# Patient Record
Sex: Female | Born: 1995 | Race: Black or African American | Hispanic: No | Marital: Single | Smoking: Former smoker
Health system: Southern US, Community
[De-identification: ages and names within clinical notes are randomized; demographics above are authoritative.]

## PROBLEM LIST (undated history)

## (undated) ENCOUNTER — Inpatient Hospital Stay (HOSPITAL_COMMUNITY): Payer: Self-pay

## (undated) DIAGNOSIS — B9689 Other specified bacterial agents as the cause of diseases classified elsewhere: Secondary | ICD-10-CM

## (undated) DIAGNOSIS — D649 Anemia, unspecified: Secondary | ICD-10-CM

## (undated) DIAGNOSIS — N760 Acute vaginitis: Secondary | ICD-10-CM

## (undated) DIAGNOSIS — Z789 Other specified health status: Secondary | ICD-10-CM

## (undated) HISTORY — PX: NO PAST SURGERIES: SHX2092

---

## 2016-08-19 LAB — OB RESULTS CONSOLE GC/CHLAMYDIA
Chlamydia: NEGATIVE
Gonorrhea: NEGATIVE

## 2016-08-20 LAB — OB RESULTS CONSOLE HIV ANTIBODY (ROUTINE TESTING): HIV: NONREACTIVE

## 2016-08-20 LAB — OB RESULTS CONSOLE RPR: RPR: NONREACTIVE

## 2016-08-20 LAB — OB RESULTS CONSOLE ANTIBODY SCREEN: Antibody Screen: NEGATIVE

## 2016-08-20 LAB — OB RESULTS CONSOLE ABO/RH: RH Type: POSITIVE

## 2016-09-25 ENCOUNTER — Encounter (HOSPITAL_COMMUNITY): Payer: Self-pay | Admitting: *Deleted

## 2016-09-25 ENCOUNTER — Inpatient Hospital Stay (HOSPITAL_COMMUNITY)
Admission: AD | Admit: 2016-09-25 | Discharge: 2016-09-26 | Disposition: A | Discharge status: Home / Self Care | Payer: Medicaid Other | Source: Ambulatory Visit | Attending: Obstetrics & Gynecology | Admitting: Obstetrics & Gynecology

## 2016-09-25 DIAGNOSIS — B9689 Other specified bacterial agents as the cause of diseases classified elsewhere: Secondary | ICD-10-CM

## 2016-09-25 DIAGNOSIS — O23593 Infection of other part of genital tract in pregnancy, third trimester: Secondary | ICD-10-CM | POA: Diagnosis not present

## 2016-09-25 DIAGNOSIS — Z8619 Personal history of other infectious and parasitic diseases: Secondary | ICD-10-CM | POA: Diagnosis not present

## 2016-09-25 DIAGNOSIS — Z3A29 29 weeks gestation of pregnancy: Secondary | ICD-10-CM | POA: Diagnosis not present

## 2016-09-25 DIAGNOSIS — N760 Acute vaginitis: Secondary | ICD-10-CM | POA: Diagnosis present

## 2016-09-25 DIAGNOSIS — R109 Unspecified abdominal pain: Secondary | ICD-10-CM

## 2016-09-25 DIAGNOSIS — O26893 Other specified pregnancy related conditions, third trimester: Secondary | ICD-10-CM

## 2016-09-25 HISTORY — DX: Other specified health status: Z78.9

## 2016-09-25 HISTORY — DX: Other specified bacterial agents as the cause of diseases classified elsewhere: B96.89

## 2016-09-25 HISTORY — DX: Acute vaginitis: N76.0

## 2016-09-25 HISTORY — DX: Acute vaginitis: B96.89

## 2016-09-25 LAB — URINALYSIS, ROUTINE W REFLEX MICROSCOPIC
BACTERIA UA: NONE SEEN
BILIRUBIN URINE: NEGATIVE
GLUCOSE, UA: NEGATIVE mg/dL
HGB URINE DIPSTICK: NEGATIVE
Ketones, ur: NEGATIVE mg/dL
NITRITE: NEGATIVE
PH: 6 (ref 5.0–8.0)
Protein, ur: NEGATIVE mg/dL
SPECIFIC GRAVITY, URINE: 1.017 (ref 1.005–1.030)

## 2016-09-25 LAB — COMPREHENSIVE METABOLIC PANEL
ALK PHOS: 78 U/L (ref 38–126)
ALT: 12 U/L — AB (ref 14–54)
ANION GAP: 5 (ref 5–15)
AST: 22 U/L (ref 15–41)
Albumin: 3.1 g/dL — ABNORMAL LOW (ref 3.5–5.0)
BUN: <5 mg/dL — ABNORMAL LOW (ref 6–20)
CALCIUM: 9.1 mg/dL (ref 8.9–10.3)
CHLORIDE: 104 mmol/L (ref 101–111)
CO2: 25 mmol/L (ref 22–32)
CREATININE: 0.53 mg/dL (ref 0.44–1.00)
Glucose, Bld: 76 mg/dL (ref 65–99)
Potassium: 3.9 mmol/L (ref 3.5–5.1)
SODIUM: 134 mmol/L — AB (ref 135–145)
Total Bilirubin: 0.3 mg/dL (ref 0.3–1.2)
Total Protein: 7 g/dL (ref 6.5–8.1)

## 2016-09-25 LAB — CBC WITH DIFFERENTIAL/PLATELET
Basophils Absolute: 0 10*3/uL (ref 0.0–0.1)
Basophils Relative: 0 %
EOS ABS: 0.1 10*3/uL (ref 0.0–0.7)
EOS PCT: 1 %
HCT: 29.3 % — ABNORMAL LOW (ref 36.0–46.0)
Hemoglobin: 10.1 g/dL — ABNORMAL LOW (ref 12.0–15.0)
LYMPHS ABS: 2.7 10*3/uL (ref 0.7–4.0)
LYMPHS PCT: 29 %
MCH: 25.2 pg — AB (ref 26.0–34.0)
MCHC: 34.5 g/dL (ref 30.0–36.0)
MCV: 73.1 fL — AB (ref 78.0–100.0)
MONO ABS: 0.3 10*3/uL (ref 0.1–1.0)
MONOS PCT: 3 %
Neutro Abs: 6.1 10*3/uL (ref 1.7–7.7)
Neutrophils Relative %: 66 %
PLATELETS: 223 10*3/uL (ref 150–400)
RBC: 4.01 MIL/uL (ref 3.87–5.11)
RDW: 14.4 % (ref 11.5–15.5)
WBC: 9.2 10*3/uL (ref 4.0–10.5)

## 2016-09-25 LAB — FETAL FIBRONECTIN: Fetal Fibronectin: NEGATIVE

## 2016-09-25 LAB — WET PREP, GENITAL
Sperm: NONE SEEN
TRICH WET PREP: NONE SEEN
YEAST WET PREP: NONE SEEN

## 2016-09-25 MED ORDER — METRONIDAZOLE 500 MG PO TABS: 500.0000 mg | ORAL_TABLET | Freq: Two times a day (BID) | ORAL | 0 refills | Status: AC

## 2016-09-25 NOTE — Discharge Instructions (Signed)
Stay well-hydrated with at least 8 bottles of water daily It is safe for you to take Tylenol [phone] mg by mouth every 6-8 hrs as needed for pain

## 2016-09-25 NOTE — MAU Note (Signed)
Pt reports she started having lower abd pain/cramping about one hour ago after an episode of diarrhea. Denies bleeding.

## 2016-09-25 NOTE — MAU Provider Note (Signed)
History     CSN: [id number]  Arrival date and time: 09/25/16 2126   First Provider Initiated Contact with Patient 09/25/16 2157      Chief Complaint  Patient presents with  . Abdominal Cramping   Ms. Miranda James is a 21 yo G1P0 at 29.[redacted] wks gestation presenting tonight with complaints of abdominal cramping like menstrual cramps and 1 episode of diarrhea.  She reports that she had cereal with milk and fish today.  She seems to think that the milk she had is causing her cramping and diarrhea.  She has not had this type of pain in the pregnancy before.  She reports good FM today. She denies N/V, VB or abnormal vaginal d/c.  Abdominal Pain  This is a new problem. The current episode started today. The onset quality is sudden. The problem occurs intermittently. The most recent episode lasted 2 hours. The problem has been unchanged. The pain is located in the suprapubic region. The pain is at a severity of 8/10. The pain is moderate. The quality of the pain is cramping. The abdominal pain does not radiate. Associated symptoms include diarrhea. Pertinent negatives include no nausea or vomiting. Nothing aggravates the pain. The pain is relieved by nothing. She has tried nothing for the symptoms. The treatment provided no relief.    Past Medical History:  Diagnosis Date  . Bacterial vaginitis 09/25/2016  . Medical history non-contributory     Past Surgical History:  Procedure Laterality Date  . NO PAST SURGERIES      History reviewed. No pertinent family history.  Social History  Substance Use Topics  . Smoking status: Never Smoker  . Smokeless tobacco: Never Used  . Alcohol use No    Allergies: No Known Allergies  No prescriptions prior to admission.    Review of Systems  Constitutional: Negative.   HENT: Negative.   Eyes: Negative.   Respiratory: Negative.   Cardiovascular: Negative.   Gastrointestinal: Positive for abdominal pain (lower abdomen) and diarrhea. Negative  for nausea and vomiting.  Endocrine: Negative.   Genitourinary: Negative.   Musculoskeletal: Negative.   Skin: Negative.   Allergic/Immunologic: Negative.   Neurological: Negative.   Hematological: Negative.   Psychiatric/Behavioral: Negative.    Physical Exam   Blood pressure 118/68, pulse 93, temperature 98.5 F (36.9 C), temperature source Oral, resp. rate 19, height 5\' 1"  (1.549 m), weight 78.5 kg (173 lb), SpO2 100 %.  Physical Exam  Constitutional: She is oriented to person, place, and time. She appears well-developed and well-nourished.  HENT:  Head: Normocephalic.  Eyes: Pupils are equal, round, and reactive to light.  Neck: Normal range of motion.  Cardiovascular: Normal rate, regular rhythm, normal heart sounds and intact distal pulses.   Respiratory: Effort normal and breath sounds normal.  GI: Soft. Bowel sounds are normal. She exhibits no mass. There is tenderness (in all quadrants). There is no rebound and no guarding.  Genitourinary: Rectum normal. Uterus is enlarged (S=D). Cervix exhibits discharge. Cervix exhibits no motion tenderness and no friability. Right adnexum displays tenderness (exquisite). Left adnexum displays tenderness (exquisite).    Genitourinary Comments: cx; smooth, pink, small lesions noted at 5 o'clock position, small amt of yellow-green d/c from cervical os, closed/long/soft, no CMT or friability, exquisite adnexal tenderness bilaterally   Musculoskeletal: Normal range of motion.  Neurological: She is alert and oriented to person, place, and time. She has normal reflexes.  Skin: Skin is warm and dry.  Psychiatric: She has a normal mood and  affect. Her behavior is normal. Judgment and thought content normal.   Results for orders placed or performed during the hospital encounter of 09/25/16 (from the past 24 hour(s))  Urinalysis, Routine w reflex microscopic     Status: Abnormal   Collection Time: 09/25/16  9:26 PM  Result Value Ref Range    Color, Urine YELLOW YELLOW   APPearance CLOUDY (A) CLEAR   Specific Gravity, Urine 1.017 1.005 - 1.030   pH 6.0 5.0 - 8.0   Glucose, UA NEGATIVE NEGATIVE mg/dL   Hgb urine dipstick NEGATIVE NEGATIVE   Bilirubin Urine NEGATIVE NEGATIVE   Ketones, ur NEGATIVE NEGATIVE mg/dL   Protein, ur NEGATIVE NEGATIVE mg/dL   Nitrite NEGATIVE NEGATIVE   Leukocytes, UA MODERATE (A) NEGATIVE   RBC / HPF 0-5 0 - 5 RBC/hpf   WBC, UA 6-30 0 - 5 WBC/hpf   Bacteria, UA NONE SEEN NONE SEEN   Squamous Epithelial / LPF TOO NUMEROUS TO COUNT (A) NONE SEEN   Mucous PRESENT   Wet prep, genital     Status: Abnormal   Collection Time: 09/25/16 10:07 PM  Result Value Ref Range   Yeast Wet Prep HPF POC NONE SEEN NONE SEEN   Trich, Wet Prep NONE SEEN NONE SEEN   Clue Cells Wet Prep HPF POC PRESENT (A) NONE SEEN   WBC, Wet Prep HPF POC MODERATE (A) NONE SEEN   Sperm NONE SEEN   Fetal fibronectin     Status: None   Collection Time: 09/25/16 10:07 PM  Result Value Ref Range   Fetal Fibronectin NEGATIVE NEGATIVE  CBC with Differential/Platelet     Status: Abnormal   Collection Time: 09/25/16 10:35 PM  Result Value Ref Range   WBC 9.2 4.0 - 10.5 K/uL   RBC 4.01 3.87 - 5.11 MIL/uL   Hemoglobin 10.1 (L) 12.0 - 15.0 g/dL   HCT 16.129.3 (L) 09.636.0 - 04.546.0 %   MCV 73.1 (L) 78.0 - 100.0 fL   MCH 25.2 (L) 26.0 - 34.0 pg   MCHC 34.5 30.0 - 36.0 g/dL   RDW 40.914.4 81.111.5 - 91.415.5 %   Platelets 223 150 - 400 K/uL   Neutrophils Relative % 66 %   Neutro Abs 6.1 1.7 - 7.7 K/uL   Lymphocytes Relative 29 %   Lymphs Abs 2.7 0.7 - 4.0 K/uL   Monocytes Relative 3 %   Monocytes Absolute 0.3 0.1 - 1.0 K/uL   Eosinophils Relative 1 %   Eosinophils Absolute 0.1 0.0 - 0.7 K/uL   Basophils Relative 0 %   Basophils Absolute 0.0 0.0 - 0.1 K/uL    CEFM  FHR: 125 bpm / moderate variability / accels present / decels absent TOCO: none  MAU Course  Procedures  MDM CBC w/ diff CMP NST Wet Prep GC/CT Consult with Dr. Mora ApplPinn - ok to d/c  with Rx for Flagyl, stay hydrated, ok to take Tylenol prn Assessment and Plan  Bacterial vaginitis - Flagyl 500 mg po BID x 7 days  Abdominal pain during pregnancy in third trimester - Educated to stay well-hydrated with at least 8 bottles of water daily - Advised to take Tylenol [phone] mg po every 6-8 hrs prn pain - F/U with GVOB prn and at scheduled appts  Discharge home Patient verbalized an understanding of the plan of care and agrees.   Raelyn Moraolitta Rosco Harriott, MSN, CNM 09/25/2016, 11:27 PM

## 2016-09-26 LAB — GC/CHLAMYDIA PROBE AMP (~~LOC~~) NOT AT ARMC
CHLAMYDIA, DNA PROBE: NEGATIVE
NEISSERIA GONORRHEA: NEGATIVE

## 2016-10-16 LAB — OB RESULTS CONSOLE HEPATITIS B SURFACE ANTIGEN: HEP B S AG: NEGATIVE

## 2016-11-12 LAB — OB RESULTS CONSOLE GC/CHLAMYDIA
CHLAMYDIA, DNA PROBE: NEGATIVE
GC PROBE AMP, GENITAL: NEGATIVE

## 2016-11-14 LAB — OB RESULTS CONSOLE GBS: GBS: NEGATIVE

## 2016-12-10 ENCOUNTER — Inpatient Hospital Stay (HOSPITAL_COMMUNITY): Admission: AD | Admit: 2016-12-10 | Payer: Medicaid Other | Source: Ambulatory Visit | Admitting: Family Medicine

## 2016-12-14 ENCOUNTER — Inpatient Hospital Stay (HOSPITAL_COMMUNITY)
Admission: RE | Admit: 2016-12-14 | Discharge: 2016-12-18 | DRG: 766 | Disposition: A | Discharge status: Home / Self Care | Payer: Medicaid Other | Source: Ambulatory Visit | Attending: Obstetrics & Gynecology | Admitting: Obstetrics & Gynecology

## 2016-12-14 ENCOUNTER — Inpatient Hospital Stay (HOSPITAL_COMMUNITY): Payer: Medicaid Other | Admitting: Anesthesiology

## 2016-12-14 ENCOUNTER — Encounter (HOSPITAL_COMMUNITY): Payer: Self-pay

## 2016-12-14 DIAGNOSIS — O365930 Maternal care for other known or suspected poor fetal growth, third trimester, not applicable or unspecified: Principal | ICD-10-CM | POA: Diagnosis present

## 2016-12-14 DIAGNOSIS — O9902 Anemia complicating childbirth: Secondary | ICD-10-CM | POA: Diagnosis present

## 2016-12-14 DIAGNOSIS — Z3A40 40 weeks gestation of pregnancy: Secondary | ICD-10-CM

## 2016-12-14 DIAGNOSIS — O365990 Maternal care for other known or suspected poor fetal growth, unspecified trimester, not applicable or unspecified: Secondary | ICD-10-CM | POA: Diagnosis present

## 2016-12-14 DIAGNOSIS — D649 Anemia, unspecified: Secondary | ICD-10-CM | POA: Diagnosis present

## 2016-12-14 LAB — CBC
HCT: 33 % — ABNORMAL LOW (ref 36.0–46.0)
Hemoglobin: 10.8 g/dL — ABNORMAL LOW (ref 12.0–15.0)
MCH: 23.1 pg — AB (ref 26.0–34.0)
MCHC: 32.7 g/dL (ref 30.0–36.0)
MCV: 70.7 fL — AB (ref 78.0–100.0)
PLATELETS: 212 10*3/uL (ref 150–400)
RBC: 4.67 MIL/uL (ref 3.87–5.11)
RDW: 15.3 % (ref 11.5–15.5)
WBC: 7.5 10*3/uL (ref 4.0–10.5)

## 2016-12-14 LAB — ABO/RH: ABO/RH(D): O POS

## 2016-12-14 LAB — TYPE AND SCREEN
ABO/RH(D): O POS
Antibody Screen: NEGATIVE

## 2016-12-14 MED ORDER — TERBUTALINE SULFATE 1 MG/ML IJ SOLN: 0.2500 mg | Freq: Once | INTRAMUSCULAR | Status: DC | PRN

## 2016-12-14 MED ORDER — LACTATED RINGERS IV SOLN
INTRAVENOUS | Status: DC
  Administered 2016-12-14 – 2016-12-15 (×2): via INTRAVENOUS

## 2016-12-14 MED ORDER — BUTORPHANOL TARTRATE 1 MG/ML IJ SOLN: 1.0000 mg | INTRAMUSCULAR | Status: DC | PRN

## 2016-12-14 MED ORDER — PHENYLEPHRINE 40 MCG/ML (10ML) SYRINGE FOR IV PUSH (FOR BLOOD PRESSURE SUPPORT): 80.0000 ug | PREFILLED_SYRINGE | INTRAVENOUS | Status: DC | PRN

## 2016-12-14 MED ORDER — OXYCODONE-ACETAMINOPHEN 5-325 MG PO TABS: 2.0000 | ORAL_TABLET | ORAL | Status: DC | PRN

## 2016-12-14 MED ORDER — LIDOCAINE HCL (PF) 1 % IJ SOLN: 30.0000 mL | INTRAMUSCULAR | Status: DC | PRN

## 2016-12-14 MED ORDER — LACTATED RINGERS IV SOLN
500.0000 mL | INTRAVENOUS | Status: DC | PRN
  Administered 2016-12-14: 1000 mL via INTRAVENOUS
  Administered 2016-12-14: 500 mL via INTRAVENOUS

## 2016-12-14 MED ORDER — OXYTOCIN BOLUS FROM INFUSION: 500.0000 mL | Freq: Once | INTRAVENOUS | Status: DC

## 2016-12-14 MED ORDER — DIPHENHYDRAMINE HCL 50 MG/ML IJ SOLN: 12.5000 mg | INTRAMUSCULAR | Status: DC | PRN

## 2016-12-14 MED ORDER — EPHEDRINE 5 MG/ML INJ: 10.0000 mg | INTRAVENOUS | Status: DC | PRN

## 2016-12-14 MED ORDER — LACTATED RINGERS IV SOLN: 500.0000 mL | Freq: Once | INTRAVENOUS | Status: DC

## 2016-12-14 MED ORDER — OXYTOCIN 40 UNITS IN LACTATED RINGERS INFUSION - SIMPLE MED
2.5000 [IU]/h | INTRAVENOUS | Status: DC
  Filled 2016-12-14: qty 1000

## 2016-12-14 MED ORDER — LACTATED RINGERS IV SOLN
500.0000 mL | Freq: Once | INTRAVENOUS | Status: AC
  Administered 2016-12-14: 500 mL via INTRAVENOUS

## 2016-12-14 MED ORDER — ONDANSETRON HCL 4 MG/2ML IJ SOLN: 4.0000 mg | Freq: Four times a day (QID) | INTRAMUSCULAR | Status: DC | PRN

## 2016-12-14 MED ORDER — OXYTOCIN 40 UNITS IN LACTATED RINGERS INFUSION - SIMPLE MED
1.0000 m[IU]/min | INTRAVENOUS | Status: DC
  Administered 2016-12-14: 2 m[IU]/min via INTRAVENOUS
  Administered 2016-12-14: 3 m[IU]/min via INTRAVENOUS

## 2016-12-14 MED ORDER — ACETAMINOPHEN 325 MG PO TABS: 650.0000 mg | ORAL_TABLET | ORAL | Status: DC | PRN

## 2016-12-14 MED ORDER — SOD CITRATE-CITRIC ACID 500-334 MG/5ML PO SOLN
30.0000 mL | ORAL | Status: DC | PRN
  Filled 2016-12-14: qty 15

## 2016-12-14 MED ORDER — PHENYLEPHRINE 40 MCG/ML (10ML) SYRINGE FOR IV PUSH (FOR BLOOD PRESSURE SUPPORT)
80.0000 ug | PREFILLED_SYRINGE | INTRAVENOUS | Status: DC | PRN
  Filled 2016-12-14: qty 10

## 2016-12-14 MED ORDER — FENTANYL 2.5 MCG/ML BUPIVACAINE 1/10 % EPIDURAL INFUSION (WH - ANES)
14.0000 mL/h | INTRAMUSCULAR | Status: DC | PRN
  Administered 2016-12-14 – 2016-12-15 (×2): 14 mL/h via EPIDURAL
  Filled 2016-12-14 (×2): qty 100

## 2016-12-14 MED ORDER — OXYCODONE-ACETAMINOPHEN 5-325 MG PO TABS: 1.0000 | ORAL_TABLET | ORAL | Status: DC | PRN

## 2016-12-14 NOTE — Anesthesia Procedure Notes (Addendum)
Epidural Patient location during procedure: OB Start time: 12/14/2016 5:28 PM End time: 12/14/2016 5:34 PM  Staffing Anesthesiologist: Marcene Duos Performed: anesthesiologist   Preanesthetic Checklist Completed: patient identified, site marked, surgical consent, pre-op evaluation, timeout performed, IV checked, risks and benefits discussed and monitors and equipment checked  Epidural Patient position: sitting Prep: site prepped and draped and DuraPrep Patient monitoring: continuous pulse ox and blood pressure Approach: midline Location: L4-L5 Injection technique: LOR air  Needle:  Needle type: Tuohy  Needle gauge: 17 G Needle length: 9 cm and 9 Needle insertion depth: 8 cm Catheter type: closed end flexible Catheter size: 19 Gauge Catheter at skin depth: 13 cm Test dose: negative  Assessment Events: blood not aspirated, injection not painful, no injection resistance, negative IV test and no paresthesia

## 2016-12-14 NOTE — Anesthesia Pain Management Evaluation Note (Signed)
  CRNA Pain Management Visit Note  Patient: Miranda James, 21 y.o., female  "Hello I am a member of the anesthesia team at [facility]. We have an anesthesia team available at all times to provide care throughout the hospital, including epidural management and anesthesia for C-section. I don't know your plan for the delivery whether it a natural birth, water birth, IV sedation, nitrous supplementation, doula or epidural, but we want to meet your pain goals."   1.Was your pain managed to your expectations on prior hospitalizations?   No prior hospitalizations  2.What is your expectation for pain management during this hospitalization?     Epidural  3.How can we help you reach that goal?  Epidural in place  Record the patient's initial score and the patient's pain goal.   Pain: 0  Pain Goal: 5 [facility] wants you to be able to say your pain was always managed very well.  Debi Cousin 12/14/2016

## 2016-12-14 NOTE — H&P (Signed)
Miranda James is a 21 y.o. female G1P0 at 40 weeks 4 days presenting for induction of labor for intrauterine growth restriction.  All biometric parameters were <10% on growth ultrasound in office yesterday. Fetal well being was reassuring NST Cat I and BPP 8/8.  Patient feeling mild contractions, no leaking of fluid, no vaginal bleeding.  She feels good fetal movement.    OB History    Gravida Para Term Preterm AB Living   1             SAB TAB Ectopic Multiple Live Births                 Past Medical History:  Diagnosis Date  . Bacterial vaginitis 09/25/2016  . Medical history non-contributory    Past Surgical History:  Procedure Laterality Date  . NO PAST SURGERIES     Family History: family history is not on file. Social History:  reports that she has never smoked. She has never used smokeless tobacco. She reports that she does not drink alcohol or use drugs.     Maternal Diabetes: No Genetic Screening: Normal Maternal Ultrasounds/Referrals: Normal Fetal Ultrasounds or other Referrals:  None Maternal Substance Abuse:  No Significant Maternal Medications:  None Significant Maternal Lab Results:  Lab values include: Group B Strep negative Other Comments:  None  ROS as per HPI History   Resp. rate 18, height 5\' 1"  (1.549 m), weight 80.7 kg (178 lb). Exam - Cervical exam by RN 3cm / 80 / -3  Physical Exam  Prenatal labs: ABO, Rh: O/Positive/-- (05/01 0000) Antibody: Negative (05/01 0000) Rubella:  Immune RPR: Nonreactive (05/01 0000)  HBsAg: Negative (06/27 0000)  HIV: Non-reactive (05/01 0000)  GBS: Negative (07/26 0000)   Assessment/Plan: 21 yo G1P0 at 40 weeks 4 days with IUGR Admit to Labor and Delivery   Continuous monitoring Epidural on demand Pitocin for induction, once head engaged with AROM  Miranda James Miranda James 12/14/2016, 3:24 PM

## 2016-12-14 NOTE — Anesthesia Preprocedure Evaluation (Addendum)
Anesthesia Evaluation  Patient identified by MRN, date of birth, ID band Patient awake    Reviewed: Allergy & Precautions, Patient's Chart, lab work & pertinent test results  Airway Mallampati: II  TM Distance: >3 FB     Dental   Pulmonary neg pulmonary ROS,    Pulmonary exam normal        Cardiovascular negative cardio ROS Normal cardiovascular exam     Neuro/Psych negative neurological ROS     GI/Hepatic negative GI ROS, Neg liver ROS,   Endo/Other  negative endocrine ROS  Renal/GU negative Renal ROS     Musculoskeletal   Abdominal   Peds  Hematology  (+) anemia ,   Anesthesia Other Findings   Reproductive/Obstetrics (+) Pregnancy C/section for fetal bradycardia                            Lab Results  Component Value Date   WBC 7.5 12/14/2016   HGB 10.8 (L) 12/14/2016   HCT 33.0 (L) 12/14/2016   MCV 70.7 (L) 12/14/2016   PLT 212 12/14/2016   Lab Results  Component Value Date   CREATININE 0.53 09/25/2016   BUN <5 (L) 09/25/2016   NA 134 (L) 09/25/2016   K 3.9 09/25/2016   CL 104 09/25/2016   CO2 25 09/25/2016    Anesthesia Physical Anesthesia Plan  ASA: II  Anesthesia Plan: Epidural   Post-op Pain Management:    Induction:   PONV Risk Score and Plan: 3 and Ondansetron, Scopolamine patch - Pre-op, Metaclopromide and Treatment may vary due to age or medical condition  Airway Management Planned: Natural Airway  Additional Equipment:   Intra-op Plan:   Post-operative Plan:   Informed Consent: I have reviewed the patients History and Physical, chart, labs and discussed the procedure including the risks, benefits and alternatives for the proposed anesthesia with the patient or authorized representative who has indicated his/her understanding and acceptance.     Plan Discussed with:   Anesthesia Plan Comments:        Anesthesia Quick Evaluation

## 2016-12-15 ENCOUNTER — Encounter (HOSPITAL_COMMUNITY)
Admission: RE | Disposition: A | Discharge status: Home / Self Care | Payer: Self-pay | Source: Ambulatory Visit | Attending: Obstetrics & Gynecology

## 2016-12-15 ENCOUNTER — Encounter (HOSPITAL_COMMUNITY): Payer: Self-pay

## 2016-12-15 LAB — CBC
HEMATOCRIT: 29.4 % — AB (ref 36.0–46.0)
HEMOGLOBIN: 9.7 g/dL — AB (ref 12.0–15.0)
MCH: 23.3 pg — ABNORMAL LOW (ref 26.0–34.0)
MCHC: 33 g/dL (ref 30.0–36.0)
MCV: 70.7 fL — ABNORMAL LOW (ref 78.0–100.0)
Platelets: 190 10*3/uL (ref 150–400)
RBC: 4.16 MIL/uL (ref 3.87–5.11)
RDW: 15.2 % (ref 11.5–15.5)
WBC: 11.7 10*3/uL — ABNORMAL HIGH (ref 4.0–10.5)

## 2016-12-15 LAB — RPR: RPR: NONREACTIVE

## 2016-12-15 SURGERY — Surgical Case
Anesthesia: Epidural

## 2016-12-15 MED ORDER — NALBUPHINE HCL 10 MG/ML IJ SOLN: 5.0000 mg | INTRAMUSCULAR | Status: DC | PRN

## 2016-12-15 MED ORDER — MORPHINE SULFATE (PF) 0.5 MG/ML IJ SOLN
INTRAMUSCULAR | Status: DC | PRN
  Administered 2016-12-15: 4 mg via EPIDURAL

## 2016-12-15 MED ORDER — OXYCODONE HCL 5 MG PO TABS: 5.0000 mg | ORAL_TABLET | ORAL | Status: DC | PRN

## 2016-12-15 MED ORDER — ZOLPIDEM TARTRATE 5 MG PO TABS: 5.0000 mg | ORAL_TABLET | Freq: Every evening | ORAL | Status: DC | PRN

## 2016-12-15 MED ORDER — BUPIVACAINE HCL (PF) 0.25 % IJ SOLN
INTRAMUSCULAR | Status: DC | PRN
  Administered 2016-12-15: 30 mL

## 2016-12-15 MED ORDER — NALOXONE HCL 2 MG/2ML IJ SOSY
1.0000 ug/kg/h | PREFILLED_SYRINGE | INTRAVENOUS | Status: DC | PRN
  Filled 2016-12-15: qty 2

## 2016-12-15 MED ORDER — ONDANSETRON HCL 4 MG/2ML IJ SOLN
INTRAMUSCULAR | Status: DC | PRN
  Administered 2016-12-15: 4 mg via INTRAVENOUS

## 2016-12-15 MED ORDER — LIDOCAINE-EPINEPHRINE (PF) 2 %-1:200000 IJ SOLN
INTRAMUSCULAR | Status: DC | PRN
  Administered 2016-12-15 (×3): 5 mL via EPIDURAL

## 2016-12-15 MED ORDER — DIPHENHYDRAMINE HCL 25 MG PO CAPS
25.0000 mg | ORAL_CAPSULE | Freq: Four times a day (QID) | ORAL | Status: DC | PRN
  Filled 2016-12-15 (×2): qty 1

## 2016-12-15 MED ORDER — MORPHINE SULFATE (PF) 0.5 MG/ML IJ SOLN
INTRAMUSCULAR | Status: AC
  Filled 2016-12-15: qty 10

## 2016-12-15 MED ORDER — LACTATED RINGERS IV SOLN
INTRAVENOUS | Status: DC | PRN
  Administered 2016-12-15 (×2): via INTRAVENOUS

## 2016-12-15 MED ORDER — CEFAZOLIN SODIUM-DEXTROSE 2-4 GM/100ML-% IV SOLN
INTRAVENOUS | Status: AC
  Filled 2016-12-15: qty 100

## 2016-12-15 MED ORDER — DIPHENHYDRAMINE HCL 25 MG PO CAPS
25.0000 mg | ORAL_CAPSULE | ORAL | Status: DC | PRN
  Administered 2016-12-16: 25 mg via ORAL
  Filled 2016-12-15: qty 1

## 2016-12-15 MED ORDER — KETOROLAC TROMETHAMINE 30 MG/ML IJ SOLN: 30.0000 mg | Freq: Four times a day (QID) | INTRAMUSCULAR | Status: AC | PRN

## 2016-12-15 MED ORDER — SODIUM CHLORIDE 0.9% FLUSH: 3.0000 mL | INTRAVENOUS | Status: DC | PRN

## 2016-12-15 MED ORDER — SENNOSIDES-DOCUSATE SODIUM 8.6-50 MG PO TABS
2.0000 | ORAL_TABLET | ORAL | Status: DC
  Administered 2016-12-15 – 2016-12-17 (×3): 2 via ORAL
  Filled 2016-12-15 (×5): qty 2

## 2016-12-15 MED ORDER — METOCLOPRAMIDE HCL 5 MG/ML IJ SOLN
10.0000 mg | Freq: Once | INTRAMUSCULAR | Status: AC
  Administered 2016-12-15: 10 mg via INTRAVENOUS
  Filled 2016-12-15: qty 2

## 2016-12-15 MED ORDER — DEXAMETHASONE SODIUM PHOSPHATE 10 MG/ML IJ SOLN
INTRAMUSCULAR | Status: DC | PRN
  Administered 2016-12-15: 10 mg via INTRAVENOUS

## 2016-12-15 MED ORDER — DIPHENHYDRAMINE HCL 50 MG/ML IJ SOLN
6.2500 mg | Freq: Once | INTRAMUSCULAR | Status: AC
  Administered 2016-12-15: 6.5 mg via INTRAVENOUS
  Filled 2016-12-15: qty 1

## 2016-12-15 MED ORDER — STERILE WATER FOR IRRIGATION IR SOLN
Status: DC | PRN
  Administered 2016-12-15: 1

## 2016-12-15 MED ORDER — SIMETHICONE 80 MG PO CHEW
80.0000 mg | CHEWABLE_TABLET | ORAL | Status: DC | PRN
  Filled 2016-12-15: qty 1

## 2016-12-15 MED ORDER — MEPERIDINE HCL 25 MG/ML IJ SOLN: 6.2500 mg | INTRAMUSCULAR | Status: DC | PRN

## 2016-12-15 MED ORDER — SIMETHICONE 80 MG PO CHEW
80.0000 mg | CHEWABLE_TABLET | Freq: Three times a day (TID) | ORAL | Status: DC
  Administered 2016-12-15 – 2016-12-18 (×6): 80 mg via ORAL
  Filled 2016-12-15 (×15): qty 1

## 2016-12-15 MED ORDER — SODIUM BICARBONATE 8.4 % IV SOLN
INTRAVENOUS | Status: AC
  Filled 2016-12-15: qty 50

## 2016-12-15 MED ORDER — SCOPOLAMINE 1 MG/3DAYS TD PT72
MEDICATED_PATCH | TRANSDERMAL | Status: AC
  Filled 2016-12-15: qty 2

## 2016-12-15 MED ORDER — OXYTOCIN 40 UNITS IN LACTATED RINGERS INFUSION - SIMPLE MED: 2.5000 [IU]/h | INTRAVENOUS | Status: AC

## 2016-12-15 MED ORDER — ONDANSETRON HCL 4 MG/2ML IJ SOLN
4.0000 mg | Freq: Three times a day (TID) | INTRAMUSCULAR | Status: DC | PRN
  Administered 2016-12-15: 4 mg via INTRAVENOUS
  Filled 2016-12-15: qty 2

## 2016-12-15 MED ORDER — LIDOCAINE HCL (PF) 1 % IJ SOLN
INTRAMUSCULAR | Status: DC | PRN
  Administered 2016-12-14: 2 mL
  Administered 2016-12-14: 5 mL
  Administered 2016-12-14: 3 mL

## 2016-12-15 MED ORDER — WITCH HAZEL-GLYCERIN EX PADS: 1.0000 "application " | MEDICATED_PAD | CUTANEOUS | Status: DC | PRN

## 2016-12-15 MED ORDER — DIBUCAINE 1 % RE OINT
1.0000 | TOPICAL_OINTMENT | RECTAL | Status: DC | PRN
Start: 2016-12-15 — End: 2016-12-18
  Filled 2016-12-15: qty 28

## 2016-12-15 MED ORDER — LACTATED RINGERS IV SOLN
INTRAVENOUS | Status: DC | PRN
  Administered 2016-12-15: 40 [IU] via INTRAVENOUS

## 2016-12-15 MED ORDER — SCOPOLAMINE 1 MG/3DAYS TD PT72
MEDICATED_PATCH | TRANSDERMAL | Status: DC | PRN
  Administered 2016-12-15: 1 via TRANSDERMAL

## 2016-12-15 MED ORDER — MENTHOL 3 MG MT LOZG
1.0000 | LOZENGE | OROMUCOSAL | Status: DC | PRN
  Filled 2016-12-15: qty 9

## 2016-12-15 MED ORDER — IBUPROFEN 600 MG PO TABS
600.0000 mg | ORAL_TABLET | Freq: Four times a day (QID) | ORAL | Status: DC
  Administered 2016-12-15 – 2016-12-18 (×11): 600 mg via ORAL
  Filled 2016-12-15 (×14): qty 1

## 2016-12-15 MED ORDER — OXYCODONE HCL 5 MG PO TABS
10.0000 mg | ORAL_TABLET | ORAL | Status: DC | PRN
Start: 2016-12-15 — End: 2016-12-18
  Filled 2016-12-15 (×2): qty 2

## 2016-12-15 MED ORDER — DIPHENHYDRAMINE HCL 50 MG/ML IJ SOLN: 12.5000 mg | INTRAMUSCULAR | Status: DC | PRN

## 2016-12-15 MED ORDER — SOD CITRATE-CITRIC ACID 500-334 MG/5ML PO SOLN
30.0000 mL | ORAL | Status: AC
  Administered 2016-12-15: 30 mL via ORAL

## 2016-12-15 MED ORDER — NALBUPHINE HCL 10 MG/ML IJ SOLN: 5.0000 mg | Freq: Once | INTRAMUSCULAR | Status: DC | PRN

## 2016-12-15 MED ORDER — BUPIVACAINE HCL (PF) 0.25 % IJ SOLN
INTRAMUSCULAR | Status: AC
  Filled 2016-12-15: qty 30

## 2016-12-15 MED ORDER — SODIUM CHLORIDE 0.9 % IR SOLN
Status: DC | PRN
  Administered 2016-12-15: 1

## 2016-12-15 MED ORDER — SCOPOLAMINE 1 MG/3DAYS TD PT72: 1.0000 | MEDICATED_PATCH | Freq: Once | TRANSDERMAL | Status: DC

## 2016-12-15 MED ORDER — LIDOCAINE-EPINEPHRINE (PF) 2 %-1:200000 IJ SOLN
INTRAMUSCULAR | Status: AC
  Filled 2016-12-15: qty 20

## 2016-12-15 MED ORDER — ONDANSETRON HCL 4 MG/2ML IJ SOLN
INTRAMUSCULAR | Status: AC
  Filled 2016-12-15: qty 2

## 2016-12-15 MED ORDER — NALOXONE HCL 0.4 MG/ML IJ SOLN: 0.4000 mg | INTRAMUSCULAR | Status: DC | PRN

## 2016-12-15 MED ORDER — COCONUT OIL OIL
1.0000 "application " | TOPICAL_OIL | Status: DC | PRN
  Administered 2016-12-16: 1 via TOPICAL
  Filled 2016-12-15 (×2): qty 120

## 2016-12-15 MED ORDER — TETANUS-DIPHTH-ACELL PERTUSSIS 5-2.5-18.5 LF-MCG/0.5 IM SUSP
0.5000 mL | Freq: Once | INTRAMUSCULAR | Status: DC
  Filled 2016-12-15: qty 0.5

## 2016-12-15 MED ORDER — DEXAMETHASONE SODIUM PHOSPHATE 10 MG/ML IJ SOLN
INTRAMUSCULAR | Status: AC
  Filled 2016-12-15: qty 1

## 2016-12-15 MED ORDER — CEFAZOLIN SODIUM-DEXTROSE 2-4 GM/100ML-% IV SOLN
2.0000 g | INTRAVENOUS | Status: AC
  Administered 2016-12-15: 2 g via INTRAVENOUS

## 2016-12-15 MED ORDER — OXYTOCIN 10 UNIT/ML IJ SOLN
INTRAMUSCULAR | Status: AC
  Filled 2016-12-15: qty 4

## 2016-12-15 MED ORDER — BUPIVACAINE HCL (PF) 0.5 % IJ SOLN
INTRAMUSCULAR | Status: AC
  Filled 2016-12-15: qty 30

## 2016-12-15 MED ORDER — SIMETHICONE 80 MG PO CHEW
80.0000 mg | CHEWABLE_TABLET | ORAL | Status: DC
  Administered 2016-12-15 – 2016-12-17 (×2): 80 mg via ORAL
  Filled 2016-12-15 (×5): qty 1

## 2016-12-15 MED ORDER — ACETAMINOPHEN 325 MG PO TABS: 650.0000 mg | ORAL_TABLET | ORAL | Status: DC | PRN

## 2016-12-15 MED ORDER — LACTATED RINGERS IV SOLN
INTRAVENOUS | Status: DC | PRN
  Administered 2016-12-15: 03:00:00 via INTRAVENOUS

## 2016-12-15 MED ORDER — LACTATED RINGERS IV SOLN
INTRAVENOUS | Status: DC
  Administered 2016-12-15: 13:00:00 via INTRAVENOUS

## 2016-12-15 MED ORDER — PRENATAL MULTIVITAMIN CH
1.0000 | ORAL_TABLET | Freq: Every day | ORAL | Status: DC
  Administered 2016-12-16 – 2016-12-18 (×3): 1 via ORAL
  Filled 2016-12-15 (×6): qty 1

## 2016-12-15 SURGICAL SUPPLY — 39 items
BENZOIN TINCTURE PRP APPL 2/3 (GAUZE/BANDAGES/DRESSINGS) ×3 IMPLANT
CHLORAPREP W/TINT 26ML (MISCELLANEOUS) ×3 IMPLANT
CLAMP CORD UMBIL (MISCELLANEOUS) IMPLANT
CLOSURE WOUND 1/2 X4 (GAUZE/BANDAGES/DRESSINGS) ×1
CLOTH BEACON ORANGE TIMEOUT ST (SAFETY) ×3 IMPLANT
DRSG OPSITE POSTOP 4X10 (GAUZE/BANDAGES/DRESSINGS) ×3 IMPLANT
ELECT REM PT RETURN 9FT ADLT (ELECTROSURGICAL) ×3
ELECTRODE REM PT RTRN 9FT ADLT (ELECTROSURGICAL) ×1 IMPLANT
EXTRACTOR VACUUM BELL STYLE (SUCTIONS) IMPLANT
EXTRACTOR VACUUM KIWI (MISCELLANEOUS) IMPLANT
GAUZE SPONGE 4X4 12PLY STRL LF (GAUZE/BANDAGES/DRESSINGS) ×6 IMPLANT
GLOVE BIO SURGEON STRL SZ 6.5 (GLOVE) ×2 IMPLANT
GLOVE BIO SURGEONS STRL SZ 6.5 (GLOVE) ×1
GLOVE BIOGEL PI IND STRL 7.0 (GLOVE) ×2 IMPLANT
GLOVE BIOGEL PI INDICATOR 7.0 (GLOVE) ×4
GOWN STRL REUS W/TWL LRG LVL3 (GOWN DISPOSABLE) ×9 IMPLANT
KIT ABG SYR 3ML LUER SLIP (SYRINGE) IMPLANT
NEEDLE HYPO 22GX1.5 SAFETY (NEEDLE) IMPLANT
NEEDLE HYPO 25X5/8 SAFETYGLIDE (NEEDLE) IMPLANT
NS IRRIG 1000ML POUR BTL (IV SOLUTION) ×3 IMPLANT
PACK C SECTION WH (CUSTOM PROCEDURE TRAY) ×3 IMPLANT
PAD ABD 8X7 1/2 STERILE (GAUZE/BANDAGES/DRESSINGS) ×3 IMPLANT
PAD OB MATERNITY 4.3X12.25 (PERSONAL CARE ITEMS) ×3 IMPLANT
PENCIL SMOKE EVAC W/HOLSTER (ELECTROSURGICAL) ×3 IMPLANT
RTRCTR C-SECT PINK 25CM LRG (MISCELLANEOUS) ×3 IMPLANT
STRIP CLOSURE SKIN 1/2X4 (GAUZE/BANDAGES/DRESSINGS) ×2 IMPLANT
SUT CHROMIC 2 0 CT 1 (SUTURE) ×6 IMPLANT
SUT MNCRL 0 VIOLET CTX 36 (SUTURE) ×2 IMPLANT
SUT MONOCRYL 0 CTX 36 (SUTURE) ×4
SUT PDS AB 0 CTX 36 PDP370T (SUTURE) IMPLANT
SUT PLAIN 2 0 (SUTURE)
SUT PLAIN ABS 2-0 CT1 27XMFL (SUTURE) IMPLANT
SUT PROLENE 1 CT (SUTURE) ×3 IMPLANT
SUT VIC AB 0 CTX 36 (SUTURE) ×4
SUT VIC AB 0 CTX36XBRD ANBCTRL (SUTURE) ×2 IMPLANT
SUT VIC AB 4-0 KS 27 (SUTURE) ×6 IMPLANT
SYR CONTROL 10ML LL (SYRINGE) IMPLANT
TOWEL OR 17X24 6PK STRL BLUE (TOWEL DISPOSABLE) ×3 IMPLANT
TRAY FOLEY BAG SILVER LF 14FR (SET/KITS/TRAYS/PACK) ×3 IMPLANT

## 2016-12-15 NOTE — Transfer of Care (Signed)
Immediate Anesthesia Transfer of Care Note  Patient: Miranda James  Procedure(s) Performed: Procedure(s): CESAREAN SECTION (N/A)  Patient Location: PACU  Anesthesia Type:Epidural  Level of Consciousness: awake  Airway & Oxygen Therapy: Patient Spontanous Breathing  Post-op Assessment: Report given to RN and Post -op Vital signs reviewed and stable  Post vital signs: stable  Last Vitals:  Vitals:   12/15/16 0238 12/15/16 0300  BP:  123/77  Pulse:  66  Resp:    Temp:    SpO2: 100% 100%    Last Pain:  Vitals:   12/15/16 0200  TempSrc: Oral  PainSc: 0-No pain         Complications: No apparent anesthesia complications

## 2016-12-15 NOTE — Lactation Note (Signed)
This note was copied from a baby's chart. Lactation Consultation Note  Patient Name: Miranda James Today's Date: 12/15/2016 Reason for consult: Initial assessment;Primapara;1st time breastfeeding   Initial consult at 16 hrs; Ga 40.5; BW 6 lbs, 15.8 oz.  C-section.  Mom is P1.   Mom on phone when [facility] entered room.  Baby asleep on bed with mom showing very early feeding cues (REM). Infant has breastfed x4 (15-30 min) + attempt x1 (0 min); voids-1; stools-1; LS-7 by RN. Mom states breastfeeding is going well and denies any pain with latching. Taught how to hand express with return demonstration with observation of colostrum easily flowing. Educated to feed infant with feeding cues at least 8-12 times per day, size of infant's stomach, and cluster feeding.   Mom recognized infant was showing early cues in his sleep; LC encouraged mom to open infant's clothes to get him ready for feeding since he is showing cues.   Mom stated she latched infant herself last time and will call for assistance if needed.   Lactation brochure given and informed of hospital support group and OP services. Encouraged to call for assistance as needed with feedings.      Maternal Data Has patient been taught Hand Expression?: Yes (with return demonstration and observation of colostrum easily flowing) Does the patient have breastfeeding experience prior to this delivery?: No    Type of Nipple: Everted at rest and after stimulation  Comfort (Breast/Nipple): Soft / non-tender    Lactation Tools Discussed/Used WIC Program: No   Consult Status Consult Status: Follow-up Date: 12/16/16 Follow-up type: In-patient    Miranda James 12/15/2016, 8:11 PM

## 2016-12-15 NOTE — Progress Notes (Signed)
RN called MD to report change of baseline from 120 to 110 at the start of the shift at 2010 after continuing through all interventions. RN given instruction to give liter of fluid and change position. RN notified MD 2050 for no change after giving 1000 mL of fluid and continuing position changes and oxygen given. RN instructed to continue to monitor patient due to moderate variability of fetal heart rate and accelerations noted. RN given orders to restart pitocin at 75mU/min (half of dose when discontinued).   RN starts pitocin at 101mU/min.   RN called MD to report continued change of baseline at 0153 from 110 to 90's. Pitocin discontinued, fluid bolus given, and oxygen given.   MD at bedside [phone]

## 2016-12-15 NOTE — Anesthesia Postprocedure Evaluation (Signed)
Anesthesia Post Note  Patient: Miranda James  Procedure(s) Performed: Procedure(s) (LRB): CESAREAN SECTION (N/A)     Patient location during evaluation: PACU Anesthesia Type: Epidural Level of consciousness: awake and alert Pain management: pain level controlled Vital Signs Assessment: post-procedure vital signs reviewed and stable Respiratory status: spontaneous breathing and respiratory function stable Cardiovascular status: blood pressure returned to baseline and stable Postop Assessment: epidural receding Anesthetic complications: no    Last Vitals:  Vitals:   12/15/16 0600 12/15/16 0605  BP: 124/79   Pulse: 65   Resp: 17   Temp:  36.4 C  SpO2: 98%     Last Pain:  Vitals:   12/15/16 0607  TempSrc:   PainSc: 0-No pain   Pain Goal:                 Kennieth Rad

## 2016-12-15 NOTE — Progress Notes (Signed)
Miranda James is a 21 y.o. G1P0 at [redacted]w[redacted]d by LMP admitted for induction of labor due to Poor fetal growth.  Subjective:   Objective: BP (!) 127/92   Pulse (!) 231   Temp 97.8 F (36.6 C) (Oral)   Resp 18   Ht 5\' 1"  (1.549 m)   Wt 80.7 kg (178 lb)   SpO2 100%   BMI 33.63 kg/m  No intake/output data recorded. Total I/O In: -  Out: 750 [Urine:750]  FHT:  FHR: 95 bpm, variability: absent,  accelerations:  Abscent,  decelerations:  Absent UC:   irregular, every 3-5 minutes SVE:   Dilation: 4 Effacement (%): 70 Station: -2 Exam by:: Essie Hart, MD  AROM: clear fluid FSE   Labs: Lab Results  Component Value Date   WBC 7.5 12/14/2016   HGB 10.8 (L) 12/14/2016   HCT 33.0 (L) 12/14/2016   MCV 70.7 (L) 12/14/2016   PLT 212 12/14/2016    Assessment / Plan: Induction of labor due to IUGR Discussed with patient that the fetal heartbeat is concerning as the baseline Has decreased from 110's to 100's and now to the 90's with minimal to no variability Counseled patient that I recommend we proceed with a primary cesarean delivery She voiced understanding and agrees to proceed.  Reviewed with patient the risks of a cesarean delivery.  Ancef 2GM IV on call to OR    Miranda James Miranda James 12/15/2016, 3:04 AM

## 2016-12-15 NOTE — Brief Op Note (Signed)
12/14/2016 - 12/15/2016  4:25 AM  PATIENT:  Miranda James  21 y.o. female  PRE-OPERATIVE DIAGNOSIS:  Primary Cesarean for fetal bradycardia  POST-OPERATIVE DIAGNOSIS:  samel  PROCEDURE:  Procedure(s): CESAREAN SECTION (N/A)  SURGEON:  Surgeon(s) and Role:    * Essie Hart, MD - Primary  PHYSICIAN ASSISTANT:   ASSISTANTS: none   ANESTHESIA:   epidural  EBL:  Total I/O In: 1200 [I.V.:1200] Out: 1475 [Urine:975; Blood:500]  BLOOD ADMINISTERED:none  DRAINS: Urinary Catheter (Foley)   LOCAL MEDICATIONS USED:  MARCAINE    and Amount: 30 ml  SPECIMEN:  Source of Specimen:  Placenta  DISPOSITION OF SPECIMEN:  PATHOLOGY  COUNTS:  YES  TOURNIQUET:  * No tourniquets in log *  DICTATION: .Note written in EPIC  PLAN OF CARE: Admit to inpatient   PATIENT DISPOSITION:  PACU - hemodynamically stable.   Delay start of Pharmacological VTE agent (>24hrs) due to surgical blood loss or risk of bleeding: not applicable

## 2016-12-15 NOTE — Addendum Note (Signed)
Addendum  created 12/15/16 0836 by Graciela Husbands, CRNA   Sign clinical note

## 2016-12-15 NOTE — Addendum Note (Signed)
Addendum  created 12/15/16 0809 by Graciela Husbands, CRNA   Order sets accessed

## 2016-12-15 NOTE — Anesthesia Postprocedure Evaluation (Signed)
Anesthesia Post Note  Patient: Miranda James  Procedure(s) Performed: Procedure(s) (LRB): CESAREAN SECTION (N/A)     Patient location during evaluation: Mother Baby Anesthesia Type: Epidural Level of consciousness: awake and alert and oriented Pain management: satisfactory to patient Vital Signs Assessment: post-procedure vital signs reviewed and stable Respiratory status: respiratory function stable Cardiovascular status: stable Postop Assessment: no headache, no backache, epidural receding and patient able to bend at knees Anesthetic complications: no Comments: The patient has been nauseated since surgery.  Benadryl and Reglan were ordered IV as a one time dose on post-op rounds. Will follow and consult surgeon if this does not relieve the symptoms.      Last Vitals:  Vitals:   12/15/16 0605 12/15/16 0645  BP:  118/84  Pulse:  62  Resp:  17  Temp: 36.4 C 36.6 C  SpO2:  98%    Last Pain:  Vitals:   12/15/16 0647  TempSrc:   PainSc: 0-No pain   Pain Goal:                 Miranda James

## 2016-12-15 NOTE — Op Note (Signed)
Cesarean Section Procedure Note  Miranda James  DOB:    1995/09/24  MRN:    [id number]  Date of Surgery:  12/15/2016  Indication: 40 week 5 day [facility] admitted for induction of labor for IUGR now with fetal bradycardia   Pre-operative Diagnosis: Fetal Bradycardia  Post-operative Diagnosis: same  Procedure:  Primary cesarean delivery                         Surgeon: Wynonia Hazard, MD  Assistants: none  Anesthesia: Epidural anesthesia  ASA Class: 2  Procedure Details   The patient was counseled about the risks, benefits, complications of the cesarean section. The patient concurred with the proposed plan, giving informed consent.  The site of surgery properly noted/marked. The patient was taken to Operating Room # 1, identified as Canesha Mayall.  A Time Out was held and the above information confirmed.  After epidural was found to adequate , the patient was placed in the dorsal supine position with a leftward tilt, draped and prepped in the usual sterile manner. A Pfannenstiel incision was made with a 10 blade scalpel and the incision carried down through the subcutaneous tissue to the fascia.  The fascia was incised in the midline and the fascial incision was extended laterally with Mayo scissors. The superior aspect of the fascial incision was grasped with Coker clamps x2, tented up and the rectus muscles dissected off sharply with the bovie.  The rectus was then dissected off with blunt dissection and the bovie inferiorly. The rectus muscles were separated in the midline. The abdominal peritoneum was identified, and bluntly entered using surgeons fingers. The peritoneal opening was bluntly extended with gentle pulling.  The Alexis retractor was then deployed. The vesicouterine peritoneum was identified, tented up, entered sharply with Metzenbaum scissors, and the bladder flap was created digitally. Scalpel was then used to make a low transverse incision on the uterus which was  extended laterally with  blunt dissection. The fetal vertex was identified and delivered from cephalic presentation (ROP with a compound left hand) was a  Female with Apgar scores of 8 at one minute and 9 at five minutes. After the umbilical cord was clamped and cut cord blood was obtained for evaluation. The placenta was spontaneously removed intact. The placenta was handed off to be sent as a specimen for Pathology, there appeared to be a large posterior clot, suggesting possible partial abruption.   The uterus was cleared of all clot and debris. The uterine incision was repaired with #0 Monocryl in running locked fashion. A second imbricating suture was performed using the same suture. The incision was hemostatic. Ovaries and tubes were inspected and normal. The Alexis retractor was removed. The abdominal cavity was cleared of all clot and debris. The abdominal peritoneum was reapproximated with 2-0 chromic  in a running fashion, the rectus muscles was reapproximated with #2 chromic in interrupted fashion. The fascia was closed with 0 Vicryl in a running fashion. The subcuticular layer was irrigated and all bleeders cauterized.  30 mL of 0.25% Marcaine was injected into the subcutaneous layer.  The Scarpas fascia was re-approximated with interrupted sutures of 2-0 plain.   The skin was closed with 4-0 vicryl in a subcuticular fashion using a Mellody Dance needle. The incision was dressed with benzoine, steri strips and pressure dressing. All sponge lap and needle counts were correct x3.   Patient tolerated the procedure well and recovered in stable condition following the procedure.  Instrument, sponge, and needle counts were correct prior the abdominal closure and at the conclusion of the case.   Findings: Live female infant, Apgars 8/9, clear amniotic fluid, placenta possible partial abruption, normal uterus, bilateral tubes and ovaries  Estimated Blood Loss:  IVF:  1700 mL LR         Drains: Foley  catheter  Urine output: 225 mL clear         Specimens: Placenta to Pathology         Implants: none         Complications:  None; patient tolerated the procedure well.         Disposition: PACU - hemodynamically stable.   Shaunice Levitan STACIA

## 2016-12-16 NOTE — Lactation Note (Signed)
This note was copied from a baby's chart. Lactation Consultation Note  Patient Name: Miranda James Date: 12/16/2016 Reason for consult: Follow-up assessment   P1, Baby 40 hours old. Baby unlatched just as LC entered room after 30 min feeding. Mother denies questions or concerns.  Baby is breastfeeding on both breasts. Mom encouraged to feed baby 8-12 times/24 hours and with feeding cues.  Discussed cluster feeding and depth. Suggest mother call if she needs further assistance.   Maternal Data    Feeding Feeding Type: Breast Fed  LATCH Score                   Interventions    Lactation Tools Discussed/Used     Consult Status Consult Status: Follow-up Date: 12/17/16 Follow-up type: In-patient    Dahlia Byes [facility] 12/16/2016, 7:47 PM

## 2016-12-16 NOTE — Plan of Care (Signed)
Problem: Activity: Goal: Will verbalize the importance of balancing activity with adequate rest periods Outcome: Progressing Patient ambulating in room independently.  Tolerating well.    Problem: Life Cycle: Goal: Risk for postpartum hemorrhage will decrease Outcome: Progressing Fundus firm U/E upon assessment.  Scant lochia at this time.  Will continue to monitor.  No signs of excessive bleeding.   Goal: Chance of risk for complications during the postpartum period will decrease Outcome: Progressing Patient voiding.  No signs of urinary retention or complications.   Problem: Nutritional: Goal: Dietary intake will improve Outcome: Progressing Patient maintaining a regular diet.

## 2016-12-16 NOTE — Progress Notes (Signed)
RN noted mom sleeping while nursing baby.  Rn encouraged mom not to sleep with baby in the bed or at the breast.  Patient agreeable at this time.

## 2016-12-16 NOTE — Progress Notes (Signed)
Subjective: Postpartum Day 1: Cesarean Delivery Patient reports pain controlled, no nausea or vomiting. Ambulating without difficulty  Objective: Vital signs in last 24 hours: Temp:  [98 F (36.7 C)-98.5 F (36.9 C)] 98 F (36.7 C) (08/27 0805) Pulse Rate:  [65-88] 67 (08/27 0805) Resp:  [18] 18 (08/27 0805) BP: (113-123)/(56-71) 123/69 (08/27 0805) SpO2:  [98 %-100 %] 100 % (08/27 0500)  Physical Exam:  General: alert, cooperative and appears stated age Lochia: appropriate Uterine Fundus: firm Incision: pressure dressing intact DVT Evaluation: No evidence of DVT seen on physical exam.   Recent Labs  12/14/16 1540 12/15/16 1211  HGB 10.8* 9.7*  HCT 33.0* 29.4*    Assessment/Plan: Status post Cesarean section. Doing well postoperatively.  Continue current care.  Stacee Earp H. 12/16/2016, 11:02 AM

## 2016-12-17 NOTE — Lactation Note (Signed)
This note was copied from a baby's chart. Lactation Consultation Note  Patient Name: Boy Hazeline Bachrach [id number] Date: 12/17/2016 Reason for consult: Follow-up assessment;Other (Comment) (Sore nipples/breasts.)  Baby 57 hours old. Changed a large black/brown stool diaper with void also. Mom reports sore nipples and full, leaking breasts. Mom able to latch baby to left breast in cradle position; however, baby only on tip of nipple. Assisted mom with positioning and supporting baby's head--at neck--and her breast during latch and mom reported increased comfort. Baby able to maintain a deep latch and suckle rhythmically with some swallow noted. Enc mom to use EBM after nursing to help with healing. Mom's breasts are filling, so discussed progression of milk coming to volume and engorgement prevention/treatment methods. Brought mom bags of ice for comfort and to enc good milk flow. Enc using manual pump and/or hand expression as needed. Enc mom to continue to nurse with cues and completely undress baby and nurse STS if baby sleepy at breast. Enc mom to call for assistance as needed.   Maternal Data    Feeding Feeding Type: Breast Fed  LATCH Score Latch: Grasps breast easily, tongue down, lips flanged, rhythmical sucking.  Audible Swallowing: A few with stimulation  Type of Nipple: Everted at rest and after stimulation  Comfort (Breast/Nipple): Filling, red/small blisters or bruises, mild/mod discomfort  Hold (Positioning): Assistance needed to correctly position infant at breast and maintain latch.  LATCH Score: 7  Interventions Interventions: Breast feeding basics reviewed;Skin to skin;Adjust position;Support pillows;Position options;Ice  Lactation Tools Discussed/Used Tools: Pump Breast pump type: Manual   Consult Status Consult Status: Follow-up Date: 12/18/16 Follow-up type: In-patient    Sherlyn Hay 12/17/2016, 1:13 PM

## 2016-12-17 NOTE — Progress Notes (Addendum)
Patient is eating, ambulating, voiding.  Pain control is good.  Appropriate lochia, no complaints.  Vitals:   12/16/16 0500 12/16/16 0805 12/16/16 1843 12/17/16 0601  BP: 113/71 123/69 111/67 119/67  Pulse: 77 67 87 70  Resp: 18 18 18 18   Temp: 98.2 F (36.8 C) 98 F (36.7 C) 99.2 F (37.3 C) 98.4 F (36.9 C)  TempSrc:  Oral Other (Comment)   SpO2: 100%   100%  Weight:      Height:        Fundus firm Perineum without swelling. Inc: c/d/i Ext: no CT  Lab Results  Component Value Date   WBC 11.7 (H) 12/15/2016   HGB 9.7 (L) 12/15/2016   HCT 29.4 (L) 12/15/2016   MCV 70.7 (L) 12/15/2016   PLT 190 12/15/2016    --/--/O POS, O POS (08/25 1540)  A/P Post op day #2 s/p c/s for fetal bradycardia. Doing well, would like to stay add'l day.  Routine care.  Expect d/c 8/29 .    Miranda James

## 2016-12-18 MED ORDER — IBUPROFEN 600 MG PO TABS: 600.0000 mg | ORAL_TABLET | Freq: Four times a day (QID) | ORAL | 3 refills | Status: DC | PRN

## 2016-12-18 MED ORDER — OXYCODONE-ACETAMINOPHEN 5-325 MG PO TABS: 1.0000 | ORAL_TABLET | ORAL | 0 refills | Status: DC | PRN

## 2016-12-18 NOTE — Lactation Note (Signed)
This note was copied from a baby's chart. Lactation Consultation Note  Patient Name: Boy Tashiba Timoney [id number] Date: 12/18/2016 Reason for consult: Follow-up assessment Baby at 31 hr of life and dyad set for d/c today. Mom reports bilateral sore nipples. She has been using expressed milk and coconut oil. She has been working on getting a deeper latch at every feeding. She thinks the soreness is getting better. She has the DEBP kit and a Harmony on the couch in the room for home use. She has a DEBP at home as well. She stated she knows how to use the pumps. Discussed baby behavior, feeding frequency, baby belly size, voids, wt loss, breast changes, and nipple care. Mom stated she can manual express. She is aware of lactation services and support group. She will call as needed.     Maternal Data    Feeding Feeding Type: Breast Fed Length of feed: 15 min  LATCH Score Latch: Grasps breast easily, tongue down, lips flanged, rhythmical sucking. (per mom)  Audible Swallowing: A few with stimulation  Type of Nipple: Everted at rest and after stimulation  Comfort (Breast/Nipple): Filling, red/small blisters or bruises, mild/mod discomfort  Hold (Positioning): No assistance needed to correctly position infant at breast. (per mom)  LATCH Score: 8  Interventions Interventions: Breast feeding basics reviewed;Hand express;Breast massage;Position options  Lactation Tools Discussed/Used     Consult Status Consult Status: Complete Follow-up type: Call as needed    Denzil Hughes 12/18/2016, 10:33 AM

## 2016-12-18 NOTE — Discharge Summary (Signed)
Obstetric Discharge Summary Reason for Admission: induction of labor for IUGR Prenatal Procedures: NST and ultrasound Intrapartum Procedures: cesarean: low cervical, transverse for fetal bradycardia Postpartum Procedures: none Complications-Operative and Postpartum: none Hemoglobin  Date Value Ref Range Status  12/15/2016 9.7 (L) 12.0 - 15.0 g/dL Final   HCT  Date Value Ref Range Status  12/15/2016 29.4 (L) 36.0 - 46.0 % Final    Physical Exam:  General: alert, cooperative and no distress Lochia: appropriate Uterine Fundus: firm Incision: healing well, no significant drainage, no dehiscence, no significant erythema DVT Evaluation: No evidence of DVT seen on physical exam. Negative Homan's sign. No cords or calf tenderness. No significant calf/ankle edema.  Discharge Diagnoses: Term Pregnancy-delivered  Discharge Information: Date: 12/18/2016 Activity: pelvic rest Diet: routine Medications: Ibuprofen and Percocet Condition: stable Instructions: refer to practice specific booklet Discharge to: home   Newborn Data: Live born female  Birth Weight: 6 lb 15.8 oz (3170 g) APGAR: 8, 9  Home with mother.  Essie HartINN, Shavon Zenz STACIA 12/18/2016, 8:43 AM

## 2016-12-18 NOTE — Progress Notes (Signed)
Subjective: Postpartum Day 3: Cesarean Delivery Patient reports ambulating w/o difficulty, voiding and passing flatus Pain is well controlled. No complaints.   Objective: Vital signs in last 24 hours: Temp:  [98.2 F (36.8 C)-98.3 F (36.8 C)] 98.2 F (36.8 C) (08/29 0606) Pulse Rate:  [70-93] 70 (08/29 0606) Resp:  [17-18] 18 (08/29 0606) BP: (118-135)/(72-82) 118/72 (08/29 0606)  Physical Exam:  General: alert, cooperative and no distress Lochia: appropriate Uterine Fundus: firm Incision: healing well DVT Evaluation: No evidence of DVT seen on physical exam. Negative Homan's sign. No cords or calf tenderness. No significant calf/ankle edema.   Recent Labs  12/15/16 1211  HGB 9.7*  HCT 29.4*    Assessment/Plan: POD #3 Status post Cesarean section for fetal bradycardia / IUGR Discharge patient home today with incision check / follow up in office in one week. Discharge instructions given to patient.  Circumcision in office  Essie Hart STACIA 12/18/2016, 8:35 AM

## 2017-05-16 ENCOUNTER — Encounter (HOSPITAL_COMMUNITY): Payer: Self-pay | Admitting: *Deleted

## 2017-05-16 ENCOUNTER — Inpatient Hospital Stay (HOSPITAL_COMMUNITY)
Admission: AD | Admit: 2017-05-16 | Discharge: 2017-05-16 | Disposition: A | Discharge status: Home / Self Care | Payer: Medicaid Other | Source: Ambulatory Visit | Attending: Obstetrics and Gynecology | Admitting: Obstetrics and Gynecology

## 2017-05-16 ENCOUNTER — Other Ambulatory Visit: Payer: Self-pay

## 2017-05-16 DIAGNOSIS — O219 Vomiting of pregnancy, unspecified: Secondary | ICD-10-CM | POA: Diagnosis not present

## 2017-05-16 DIAGNOSIS — Z3A01 Less than 8 weeks gestation of pregnancy: Secondary | ICD-10-CM | POA: Diagnosis not present

## 2017-05-16 DIAGNOSIS — O210 Mild hyperemesis gravidarum: Secondary | ICD-10-CM | POA: Diagnosis present

## 2017-05-16 LAB — POCT PREGNANCY, URINE: Preg Test, Ur: POSITIVE — AB

## 2017-05-16 LAB — URINALYSIS, ROUTINE W REFLEX MICROSCOPIC
GLUCOSE, UA: NEGATIVE mg/dL
HGB URINE DIPSTICK: NEGATIVE
KETONES UR: 80 mg/dL — AB
Leukocytes, UA: NEGATIVE
NITRITE: NEGATIVE
PH: 6 (ref 5.0–8.0)
PROTEIN: 100 mg/dL — AB
Specific Gravity, Urine: 1.036 — ABNORMAL HIGH (ref 1.005–1.030)

## 2017-05-16 MED ORDER — PROMETHAZINE HCL 25 MG PO TABS: 25.0000 mg | ORAL_TABLET | Freq: Four times a day (QID) | ORAL | 2 refills | Status: DC | PRN

## 2017-05-16 MED ORDER — PROMETHAZINE HCL 25 MG/ML IJ SOLN
25.0000 mg | Freq: Once | INTRAVENOUS | Status: AC
  Administered 2017-05-16: 25 mg via INTRAVENOUS
  Filled 2017-05-16: qty 1

## 2017-05-16 MED ORDER — THIAMINE HCL 100 MG/ML IJ SOLN
Freq: Once | INTRAVENOUS | Status: AC
  Administered 2017-05-16: 22:00:00 via INTRAVENOUS
  Filled 2017-05-16: qty 1000

## 2017-05-16 NOTE — MAU Provider Note (Signed)
Chief Complaint: Emesis and Hematuria   First Provider Initiated Contact with Patient 05/16/17 2001      SUBJECTIVE HPI: Miranda James is a 22 y.o. G2P1001 at [redacted]w[redacted]d by LMP who presents to maternity admissions reporting nausea and vomiting along with hematuria. She states she has not kept anything down since this past Saturday. Hx of hyperemesis in last pregnancy. She denies vaginal bleeding, vaginal itching/burning,  h/a, dizziness, or fever/chills.    Emesis   This is a new problem. The current episode started in the past 7 days. The problem occurs 5 to 10 times per day. The problem has been gradually worsening. The emesis has an appearance of bile. There has been no fever. Pertinent negatives include no abdominal pain or diarrhea. She has tried nothing for the symptoms.    Past Medical History:  Diagnosis Date  . Bacterial vaginitis 09/25/2016  . Medical history non-contributory    Past Surgical History:  Procedure Laterality Date  . CESAREAN SECTION N/A 12/15/2016   Procedure: CESAREAN SECTION;  Surgeon: Essie Hart, MD;  Location: [facility];  Service: Obstetrics;  Laterality: N/A;  . NO PAST SURGERIES     Social History   Socioeconomic History  . Marital status: Single    Spouse name: Not on file  . Number of children: Not on file  . Years of education: Not on file  . Highest education level: Not on file  Social Needs  . Financial resource strain: Not on file  . Food insecurity - worry: Not on file  . Food insecurity - inability: Not on file  . Transportation needs - medical: Not on file  . Transportation needs - non-medical: Not on file  Occupational History  . Not on file  Tobacco Use  . Smoking status: Never Smoker  . Smokeless tobacco: Never Used  Substance and Sexual Activity  . Alcohol use: No  . Drug use: No  . Sexual activity: Not on file  Other Topics Concern  . Not on file  Social History Narrative  . Not on file   No current  facility-administered medications on file prior to encounter.    Current Outpatient Medications on File Prior to Encounter  Medication Sig Dispense Refill  . Prenatal Vit-Fe Fumarate-FA (PRENATAL MULTIVITAMIN) TABS tablet Take 1 tablet by mouth daily at 12 noon.    Marland Kitchen ibuprofen (ADVIL,MOTRIN) 600 MG tablet Take 1 tablet (600 mg total) by mouth every 6 (six) hours as needed for moderate pain. 60 tablet 3  . oxyCODONE-acetaminophen (ROXICET) 5-325 MG tablet Take 1-2 tablets by mouth every 4 (four) hours as needed for severe pain. 40 tablet 0   No Known Allergies  ROS:  Review of Systems  Constitutional: Negative.   Respiratory: Negative.   Cardiovascular: Negative.   Gastrointestinal: Positive for nausea and vomiting. Negative for abdominal pain, constipation and diarrhea.  Genitourinary: Negative.   Musculoskeletal: Negative.   Neurological: Negative.   Psychiatric/Behavioral: Negative.    I have reviewed patient's Past Medical Hx, Surgical Hx, Family Hx, Social Hx, medications and allergies.   Physical Exam   Patient Vitals for the past 24 hrs:  BP Temp Temp src Pulse Resp SpO2 Weight  05/16/17 1651 125/63 98.7 F (37.1 C) Oral 78 16 100 % 181 lb (82.1 kg)   Constitutional: Well-developed, "sickly appearance" female in mild acute distress.  Cardiovascular: normal rate Respiratory: normal effort GI: Abd soft, non-tender. Pos BS x 4 MS: Extremities nontender, no edema, normal ROM Neurologic: Alert and oriented  x 4.  GU: Neg CVAT.  LAB RESULTS Results for orders placed or performed during the hospital encounter of 05/16/17 (from the past 24 hour(s))  Urinalysis, Routine w reflex microscopic     Status: Abnormal   Collection Time: 05/16/17  4:53 PM  Result Value Ref Range   Color, Urine AMBER (A) YELLOW   APPearance HAZY (A) CLEAR   Specific Gravity, Urine 1.036 (H) 1.005 - 1.030   pH 6.0 5.0 - 8.0   Glucose, UA NEGATIVE NEGATIVE mg/dL   Hgb urine dipstick NEGATIVE NEGATIVE    Bilirubin Urine SMALL (A) NEGATIVE   Ketones, ur 80 (A) NEGATIVE mg/dL   Protein, ur 782100 (A) NEGATIVE mg/dL   Nitrite NEGATIVE NEGATIVE   Leukocytes, UA NEGATIVE NEGATIVE   RBC / HPF 0-5 0 - 5 RBC/hpf   WBC, UA 0-5 0 - 5 WBC/hpf   Bacteria, UA RARE (A) NONE SEEN   Squamous Epithelial / LPF 0-5 (A) NONE SEEN   Mucus PRESENT   Pregnancy, urine POC     Status: Abnormal   Collection Time: 05/16/17  5:02 PM  Result Value Ref Range   Preg Test, Ur POSITIVE (A) NEGATIVE    --/--/O POS, O POS (08/25 1540)  MAU Management/MDM: Orders Placed This Encounter  Procedures  . Urinalysis, Routine w reflex microscopic  . Pregnancy, urine POC    Meds ordered this encounter  Medications  . promethazine (PHENERGAN) 25 mg in dextrose 5% lactated ringers 1,000 mL infusion  . sodium chloride 0.9 % 1,000 mL with thiamine 100 mg, folic acid 1 mg, multivitamins adult 10 mL infusion   Care transferred to [facility]Moni Rothrock CNM @ 2105  Steward DroneVeronica Rogers  Certified Nurse-Midwife 05/16/2017  8:09 PM  Patient reporting relief from nausea. Able to tolerate PO fluids.   1. Nausea and vomiting during pregnancy prior to [redacted] weeks gestation    -Discharge home in stable condition -Rx for phenergan sent to patient's pharmacy -Nausea and vomiting precautions discussed -[facility] care -Patient may return to MAU as needed or if her condition were to change or worsen  Rolm BookbinderCaroline M Arantza Darrington, [place] 05/16/17 10:38 PM

## 2017-05-16 NOTE — Discharge Instructions (Signed)
Safe Medications in Pregnancy   Acne: Benzoyl Peroxide Salicylic Acid  Backache/Headache: Tylenol: 2 regular strength every 4 hours OR              2 Extra strength every 6 hours  Colds/Coughs/Allergies: Benadryl (alcohol free) 25 mg every 6 hours as needed Breath right strips Claritin Cepacol throat lozenges Chloraseptic throat spray Cold-Eeze- up to three times per day Cough drops, alcohol free Flonase (by prescription only) Guaifenesin Mucinex Robitussin DM (plain only, alcohol free) Saline nasal spray/drops Sudafed (pseudoephedrine) & Actifed ** use only after [redacted] weeks gestation and if you do not have high blood pressure Tylenol Vicks Vaporub Zinc lozenges Zyrtec   Constipation: Colace Ducolax suppositories Fleet enema Glycerin suppositories Metamucil Milk of magnesia Miralax Senokot Smooth move tea  Diarrhea: Kaopectate Imodium A-D  *NO pepto Bismol  Hemorrhoids: Anusol Anusol HC Preparation H Tucks  Indigestion: Tums Maalox Mylanta Zantac  Pepcid  Insomnia: Benadryl (alcohol free) 25mg  every 6 hours as needed Tylenol PM Unisom, no Gelcaps  Leg Cramps: Tums MagGel  Nausea/Vomiting:  Bonine Dramamine Emetrol Ginger extract Sea bands Meclizine  Nausea medication to take during pregnancy:  Unisom (doxylamine succinate 25 mg tablets) Take one tablet daily at bedtime. If symptoms are not adequately controlled, the dose can be increased to a maximum recommended dose of two tablets daily (1/2 tablet in the morning, 1/2 tablet mid-afternoon and one at bedtime). Vitamin B6 100mg  tablets. Take one tablet twice a day (up to 200 mg per day).  Skin Rashes: Aveeno products Benadryl cream or 25mg  every 6 hours as needed Calamine Lotion 1% cortisone cream  Yeast infection: Gyne-lotrimin 7 Monistat 7   **If taking multiple medications, please check labels to avoid duplicating the same active ingredients **take medication as directed on  the label ** Do not exceed 4000 mg of tylenol in 24 hours **Do not take medications that contain aspirin or ibuprofen    [facility]    [facility]       Phone: [phone]  [facility] [place] Phone: [phone]  [facility] [place]  Phone: [phone]  [facility] [place] [place]  Phone: [phone]  [facility] [place]  Phone: [phone]  [facility]       Phone: [phone]  [facility]    Phone: [phone]   [facility]    Phone: [phone]  [facility]    Phone: [phone]  [facility]    Phone: [phone]  [facility]    Phone: [phone]  [facility]       Phone: [phone]   [facility]  Phone: [phone]  [facility]    Phone: [phone]  [facility]   Phone: [phone]  [facility]      Phone: [phone]  [facility]    Phone: [phone]    Nausea and Vomiting, Adult Feeling sick to your stomach (nausea) means that your stomach is upset or you feel like you have to throw up (vomit). Feeling more and more sick to your stomach can lead to throwing up. Throwing up happens when food and liquid from your stomach are thrown up and out the mouth. Throwing up can make you feel weak and cause you to get dehydrated. Dehydration can make you tired and thirsty,  make you have a dry mouth, and make it so you pee (urinate) less often. Older adults and people with other diseases or a weak defense system (immune system) are at higher risk for dehydration. If you feel sick to your stomach or if you throw up, it is important to follow  instructions from your doctor about how to take care of yourself. Follow these instructions at home: Eating and drinking Follow these instructions as told by your doctor:  Take an oral rehydration solution (ORS). This is a drink that is sold at pharmacies and stores.  Drink clear fluids in small amounts as you are able, such as: ? Water. ? Ice chips. ? Diluted fruit juice. ? Low-calorie sports drinks.  Eat bland, easy-to-digest foods in small amounts as you are able, such as: ? Bananas. ? Applesauce. ? Rice. ? Low-fat (lean) meats. ? Toast. ? Crackers.  Avoid fluids that have a lot of sugar or caffeine in them.  Avoid alcohol.  Avoid spicy or fatty foods.  General instructions  Drink enough fluid to keep your pee (urine) clear or pale yellow.  Wash your hands often. If you cannot use soap and water, use hand sanitizer.  Make sure that all people in your home wash their hands well and often.  Take over-the-counter and prescription medicines only as told by your doctor.  Rest at home while you get better.  Watch your condition for any changes.  Breathe slowly and deeply when you feel sick to your stomach.  Keep all follow-up visits as told by your doctor. This is important. Contact a doctor if:  You have a fever.  You cannot keep fluids down.  Your symptoms get worse.  You have new symptoms.  You feel sick to your stomach for more than two days.  You feel light-headed or dizzy.  You have a headache.  You have muscle cramps. Get help right away if:  You have pain in your chest, neck, arm, or jaw.  You feel very weak or you pass out (faint).  You throw up again and again.  You see blood in your throw-up.  Your throw-up looks like black coffee grounds.  You have bloody or black poop (stools) or poop that look like tar.  You have a very bad headache, a stiff neck, or both.  You have a rash.  You have very bad pain, cramping, or bloating in your  belly (abdomen).  You have trouble breathing.  You are breathing very quickly.  Your heart is beating very quickly.  Your skin feels cold and clammy.  You feel confused.  You have pain when you pee.  You have signs of dehydration, such as: ? Dark pee, hardly any pee, or no pee. ? Cracked lips. ? Dry mouth. ? Sunken eyes. ? Sleepiness. ? Weakness. These symptoms may be an emergency. Do not wait to see if the symptoms will go away. Get medical help right away. Call your local emergency services (911 in the [place] Do not drive yourself to the hospital. This information is not intended to replace advice given to you by your health care provider. Make sure you discuss any questions you have with your health care provider. Document Released: 09/25/2007 Document Revised: 10/27/2015 Document Reviewed: 12/13/2014 Elsevier Interactive Patient Education  2018 [employer].

## 2017-05-16 NOTE — Progress Notes (Signed)
Written and verbal d/c instructions given and understanding voiced. 

## 2017-05-16 NOTE — MAU Note (Signed)
Having severe vomiting, unable to keep anything down since Sat.  Has seen strings of blood when she goes to the bathroom.

## 2017-06-06 ENCOUNTER — Inpatient Hospital Stay (HOSPITAL_COMMUNITY): Payer: Medicaid Other

## 2017-06-06 ENCOUNTER — Inpatient Hospital Stay (HOSPITAL_COMMUNITY)
Admission: AD | Admit: 2017-06-06 | Discharge: 2017-06-06 | Disposition: A | Discharge status: Home / Self Care | Payer: Medicaid Other | Source: Ambulatory Visit | Attending: Obstetrics and Gynecology | Admitting: Obstetrics and Gynecology

## 2017-06-06 ENCOUNTER — Encounter (HOSPITAL_COMMUNITY): Payer: Self-pay | Admitting: *Deleted

## 2017-06-06 ENCOUNTER — Other Ambulatory Visit: Payer: Self-pay

## 2017-06-06 DIAGNOSIS — O219 Vomiting of pregnancy, unspecified: Secondary | ICD-10-CM

## 2017-06-06 DIAGNOSIS — O30041 Twin pregnancy, dichorionic/diamniotic, first trimester: Secondary | ICD-10-CM | POA: Diagnosis not present

## 2017-06-06 DIAGNOSIS — O208 Other hemorrhage in early pregnancy: Secondary | ICD-10-CM

## 2017-06-06 DIAGNOSIS — O34219 Maternal care for unspecified type scar from previous cesarean delivery: Secondary | ICD-10-CM | POA: Insufficient documentation

## 2017-06-06 DIAGNOSIS — O209 Hemorrhage in early pregnancy, unspecified: Secondary | ICD-10-CM

## 2017-06-06 DIAGNOSIS — Z3A09 9 weeks gestation of pregnancy: Secondary | ICD-10-CM | POA: Insufficient documentation

## 2017-06-06 DIAGNOSIS — R51 Headache: Secondary | ICD-10-CM | POA: Diagnosis not present

## 2017-06-06 DIAGNOSIS — O26891 Other specified pregnancy related conditions, first trimester: Secondary | ICD-10-CM | POA: Diagnosis not present

## 2017-06-06 DIAGNOSIS — Z79899 Other long term (current) drug therapy: Secondary | ICD-10-CM | POA: Diagnosis not present

## 2017-06-06 DIAGNOSIS — R531 Weakness: Secondary | ICD-10-CM | POA: Insufficient documentation

## 2017-06-06 DIAGNOSIS — O30001 Twin pregnancy, unspecified number of placenta and unspecified number of amniotic sacs, first trimester: Secondary | ICD-10-CM

## 2017-06-06 DIAGNOSIS — R109 Unspecified abdominal pain: Secondary | ICD-10-CM | POA: Diagnosis not present

## 2017-06-06 LAB — CBC WITH DIFFERENTIAL/PLATELET
BASOS ABS: 0 10*3/uL (ref 0.0–0.1)
Basophils Relative: 0 %
EOS PCT: 0 %
Eosinophils Absolute: 0 10*3/uL (ref 0.0–0.7)
HCT: 35.1 % — ABNORMAL LOW (ref 36.0–46.0)
HEMOGLOBIN: 12.2 g/dL (ref 12.0–15.0)
LYMPHS ABS: 1.7 10*3/uL (ref 0.7–4.0)
LYMPHS PCT: 27 %
MCH: 23.8 pg — ABNORMAL LOW (ref 26.0–34.0)
MCHC: 34.8 g/dL (ref 30.0–36.0)
MCV: 68.4 fL — ABNORMAL LOW (ref 78.0–100.0)
MONOS PCT: 14 %
Monocytes Absolute: 0.9 10*3/uL (ref 0.1–1.0)
NEUTROS PCT: 59 %
Neutro Abs: 3.6 10*3/uL (ref 1.7–7.7)
Other: 0 %
Platelets: 335 10*3/uL (ref 150–400)
RBC: 5.13 MIL/uL — AB (ref 3.87–5.11)
RDW: 14.6 % (ref 11.5–15.5)
WBC: 6.2 10*3/uL (ref 4.0–10.5)

## 2017-06-06 LAB — URINALYSIS, ROUTINE W REFLEX MICROSCOPIC
GLUCOSE, UA: NEGATIVE mg/dL
HGB URINE DIPSTICK: NEGATIVE
KETONES UR: 80 mg/dL — AB
NITRITE: NEGATIVE
PROTEIN: 100 mg/dL — AB
Specific Gravity, Urine: 1.029 (ref 1.005–1.030)
pH: 5 (ref 5.0–8.0)

## 2017-06-06 LAB — COMPREHENSIVE METABOLIC PANEL
ALT: 165 U/L — ABNORMAL HIGH (ref 14–54)
ANION GAP: 12 (ref 5–15)
AST: 108 U/L — ABNORMAL HIGH (ref 15–41)
Albumin: 3.8 g/dL (ref 3.5–5.0)
Alkaline Phosphatase: 122 U/L (ref 38–126)
BUN: 7 mg/dL (ref 6–20)
CHLORIDE: 97 mmol/L — AB (ref 101–111)
CO2: 19 mmol/L — AB (ref 22–32)
Calcium: 9.3 mg/dL (ref 8.9–10.3)
Creatinine, Ser: 0.5 mg/dL (ref 0.44–1.00)
Glucose, Bld: 74 mg/dL (ref 65–99)
POTASSIUM: 4 mmol/L (ref 3.5–5.1)
SODIUM: 128 mmol/L — AB (ref 135–145)
Total Bilirubin: 2.4 mg/dL — ABNORMAL HIGH (ref 0.3–1.2)
Total Protein: 7.9 g/dL (ref 6.5–8.1)

## 2017-06-06 MED ORDER — FAMOTIDINE 20 MG PO TABS: 20.0000 mg | ORAL_TABLET | Freq: Two times a day (BID) | ORAL | 2 refills | Status: DC

## 2017-06-06 MED ORDER — PROMETHAZINE HCL 25 MG/ML IJ SOLN
25.0000 mg | Freq: Once | INTRAVENOUS | Status: AC
  Administered 2017-06-06: 25 mg via INTRAVENOUS
  Filled 2017-06-06: qty 1

## 2017-06-06 NOTE — Discharge Instructions (Signed)
tMultiple Pregnancy Having a multiple pregnancy means that a woman is carrying more than one baby at a time. She may be pregnant with twins, triplets, or more. The majority of multiple pregnancies are twins. Naturally conceiving triplets or more (higher-order multiples) is rare. Multiple pregnancies are riskier than single pregnancies. A woman with a multiple pregnancy is more likely to have certain problems during her pregnancy. Therefore, she will need to have more frequent appointments for prenatal care. How does a multiple pregnancy happen? A multiple pregnancy happens when:  The woman's body releases more than one egg at a time, and then each egg gets fertilized by a different sperm. ? This is the most common type of multiple pregnancy. ? Twins or other multiples produced this way are fraternal. They are no more alike than non-multiple siblings are.  One sperm fertilizes one egg, which then divides into more than one embryo. ? Twins or other multiples produced this way are identical. Identical multiples are always the same gender, and they look very much alike.  Who is most likely to have a multiple pregnancy? A multiple pregnancy is more likely to develop in women who:  Have had fertility treatment, especially if the treatment included fertility drugs.  Are older than 22 years of age.  Have already had four or more children.  Have a family history of multiple pregnancy.  How is a multiple pregnancy diagnosed? A multiple pregnancy may be diagnosed based on:  Symptoms such as: ? Rapid weight gain in the first 3 months of pregnancy (first trimester). ? More severe nausea and breast tenderness than what is typical of a single pregnancy. ? The uterus measuring larger than what is normal for the stage of the pregnancy.  Blood tests that detect a higher-than-normal level of human chorionic gonadotropin (hCG). This is a hormone that your body produces in early pregnancy.  Ultrasound  exam. This is used to confirm that you are carrying multiples.  What risks are associated with multiple pregnancy? A multiple pregnancy puts you at a higher risk for certain problems during or after your pregnancy, including:  Having your babies delivered before you have reached a full-term pregnancy (preterm birth). A full-term pregnancy lasts for at least 37 weeks. Babies born before 37 weeks may have a higher risk of a variety of health problems, such as breathing problems, feeding difficulties, cerebral palsy, and learning disabilities.  Diabetes.  Preeclampsia. This is a serious condition that causes high blood pressure along with other symptoms, such as swelling and headaches, during pregnancy.  Excessive blood loss after childbirth (postpartum hemorrhage).  Postpartum depression.  Low birth weight of the babies.  How will having a multiple pregnancy affect my care? Your health care provider will want to monitor you more closely during your pregnancy to make sure that your babies are growing normally and that you are healthy. Follow these instructions at home: Because your pregnancy is considered to be high risk, you will need to work closely with your health care team. You may also need to make some lifestyle changes. These may include the following: Eating and drinking  Increase your nutrition. ? Follow your health care providers recommendations for weight gain. You may need to gain a little extra weight when you are pregnant with multiples. ? Eat healthy snacks often throughout the day. This can add calories and reduce nausea.  Drink enough fluid to keep your urine clear or pale yellow.  Take prenatal vitamins. Activity By 20-24 weeks, you may  need to limit your activities.  Avoid activities and work that take a lot of effort (are strenuous).  Ask your health care provider when you should stop having sexual intercourse.  Rest often.  General instructions  Do not use  any products that contain nicotine or tobacco, such as cigarettes and e-cigarettes. If you need help quitting, ask your health care provider.  Do not drink alcohol or use illegal drugs.  Take over-the-counter and prescription medicines only as told by your health care provider.  Arrange for extra help around the house.  Keep all follow-up visits and all prenatal visits as told by your health care provider. This is important. Contact a health care provider if:  You have dizziness.  You have persistent nausea, vomiting, or diarrhea.  You are having trouble gaining weight.  You have feelings of depression or other emotions that are interfering with your normal activities. Get help right away if:  You have a fever.  You have pain with urination.  You have fluid leaking from your vagina.  You have a bad-smelling vaginal discharge.  You notice increased swelling in your face, hands, legs, or ankles.  You have spotting or bleeding from your vagina.  You have pelvic cramps, pelvic pressure, or nagging pain in your abdomen or lower back.  You are having regular contractions.  You develop a severe headache, with or without visual changes.  You have shortness of breath or chest pain.  You notice less fetal movement, or no fetal movement. This information is not intended to replace advice given to you by your health care provider. Make sure you discuss any questions you have with your health care provider. Document Released: 01/16/2008 Document Revised: 12/08/2015 Document Reviewed: 12/08/2015 Elsevier Interactive Patient Education  2018 [employer]. First Trimester of Pregnancy The first trimester of pregnancy is from week 1 until the end of week 13 (months 1 through 3). During this time, your baby will begin to develop inside you. At 6-8 weeks, the eyes and face are formed, and the heartbeat can be seen on ultrasound. At the end of 12 weeks, all the baby's organs are formed.  Prenatal care is all the medical care you receive before the birth of your baby. Make sure you get good prenatal care and follow all of your doctor's instructions. Follow these instructions at home: Medicines  Take over-the-counter and prescription medicines only as told by your doctor. Some medicines are safe and some medicines are not safe during pregnancy.  Take a prenatal vitamin that contains at least 600 micrograms (mcg) of folic acid.  If you have trouble pooping (constipation), take medicine that will make your stool soft (stool softener) if your doctor approves. Eating and drinking  Eat regular, healthy meals.  Your doctor will tell you the amount of weight gain that is right for you.  Avoid raw meat and uncooked cheese.  If you feel sick to your stomach (nauseous) or throw up (vomit): ? Eat 4 or 5 small meals a day instead of 3 large meals. ? Try eating a few soda crackers. ? Drink liquids between meals instead of during meals.  To prevent constipation: ? Eat foods that are high in fiber, like fresh fruits and vegetables, whole grains, and beans. ? Drink enough fluids to keep your pee (urine) clear or pale yellow. Activity  Exercise only as told by your doctor. Stop exercising if you have cramps or pain in your lower belly (abdomen) or low back.  Do not  exercise if it is too hot, too humid, or if you are in a place of great height (high altitude).  Try to avoid standing for long periods of time. Move your legs often if you must stand in one place for a long time.  Avoid heavy lifting.  Wear low-heeled shoes. Sit and stand up straight.  You can have sex unless your doctor tells you not to. Relieving pain and discomfort  Wear a good support bra if your breasts are sore.  Take warm water baths (sitz baths) to soothe pain or discomfort caused by hemorrhoids. Use hemorrhoid cream if your doctor says it is okay.  Rest with your legs raised if you have leg cramps or low  back pain.  If you have puffy, bulging veins (varicose veins) in your legs: ? Wear support hose or compression stockings as told by your doctor. ? Raise (elevate) your feet for 15 minutes, 3-4 times a day. ? Limit salt in your food. Prenatal care  Schedule your prenatal visits by the twelfth week of pregnancy.  Write down your questions. Take them to your prenatal visits.  Keep all your prenatal visits as told by your doctor. This is important. Safety  Wear your seat belt at all times when driving.  Make a list of emergency phone numbers. The list should include numbers for family, friends, the hospital, and police and fire departments. General instructions  Ask your doctor for a referral to a local prenatal class. Begin classes no later than at the start of month 6 of your pregnancy.  Ask for help if you need counseling or if you need help with nutrition. Your doctor can give you advice or tell you where to go for help.  Do not use hot tubs, steam rooms, or saunas.  Do not douche or use tampons or scented sanitary pads.  Do not cross your legs for long periods of time.  Avoid all herbs and alcohol. Avoid drugs that are not approved by your doctor.  Do not use any tobacco products, including cigarettes, chewing tobacco, and electronic cigarettes. If you need help quitting, ask your doctor. You may get counseling or other support to help you quit.  Avoid cat litter boxes and soil used by cats. These carry germs that can cause birth defects in the baby and can cause a loss of your baby (miscarriage) or stillbirth.  Visit your dentist. At home, brush your teeth with a soft toothbrush. Be gentle when you floss. Contact a doctor if:  You are dizzy.  You have mild cramps or pressure in your lower belly.  You have a nagging pain in your belly area.  You continue to feel sick to your stomach, you throw up, or you have watery poop (diarrhea).  You have a bad smelling fluid  coming from your vagina.  You have pain when you pee (urinate).  You have increased puffiness (swelling) in your face, hands, legs, or ankles. Get help right away if:  You have a fever.  You are leaking fluid from your vagina.  You have spotting or bleeding from your vagina.  You have very bad belly cramping or pain.  You gain or lose weight rapidly.  You throw up blood. It may look like coffee grounds.  You are around people who have [place] measles, fifth disease, or chickenpox.  You have a very bad headache.  You have shortness of breath.  You have any kind of trauma, such as from a fall or  a car accident. °Summary °· The first trimester of pregnancy is from week 1 until the end of week 13 (months 1 through 3). °· To take care of yourself and your unborn baby, you will need to eat healthy meals, take medicines only if your doctor tells you to do so, and do activities that are safe for you and your baby. °· Keep all follow-up visits as told by your doctor. This is important as your doctor will have to ensure that your baby is healthy and growing well. °This information is not intended to replace advice given to you by your health care provider. Make sure you discuss any questions you have with your health care provider. °Document Released: 09/25/2007 Document Revised: 04/16/2016 Document Reviewed: 04/16/2016 °Elsevier Interactive Patient Education © 2017 Elsevier Inc. ° °

## 2017-06-06 NOTE — MAU Provider Note (Signed)
History     CSN: [id number]  Arrival date and time: 06/06/17 1813   None     Chief Complaint  Patient presents with  . Emesis  . Headache  . Cramping   HPI Miranda James is 22 y.o. G3P1011 6159w5d weeks presenting with continued nausea and vomiting. She is a patient of [facility] Pinn[facility].  States she is vomiting X10 every day.  She reports having lost 15 lbs.  Unable to keep food or fluid.  Phenergan not helping. She has a history of nausea and vomiting with this pregnancy. Seen in MAU for N&V. Treated with IV hydration and Phenergan on that date.  Home with Phenergan.  Began with dark bleeding without clots yesterday, none today.  Having menstrual like cramping. She is weak and feels like she is going to pass out.   RN Note: Pt presents with c/o vomiting, abdominal cramping, and headache that began a couple weeks ago.  Reports unable to keep anything down.  States given "nausea medicine" but it's not working.  Reports vomited 10-15 times in past 24 hours.    Past Medical History:  Diagnosis Date  . Bacterial vaginitis 09/25/2016  . Medical history non-contributory     Past Surgical History:  Procedure Laterality Date  . CESAREAN SECTION N/A 12/15/2016   Procedure: CESAREAN SECTION;  Surgeon: Essie HartPinn, Walda, MD;  [facility];  Service: Obstetrics;  Laterality: N/A;  . NO PAST SURGERIES      History reviewed. No pertinent family history.  Social History   Tobacco Use  . Smoking status: Never Smoker  . Smokeless tobacco: Never Used  Substance Use Topics  . Alcohol use: No  . Drug use: No    Allergies: No Known Allergies  Medications Prior to Admission  Medication Sig Dispense Refill Last Dose  . Prenatal Vit-Fe Fumarate-FA (PRENATAL MULTIVITAMIN) TABS tablet Take 1 tablet by mouth daily at 12 noon.   Past Week at Unknown time  . promethazine (PHENERGAN) 25 MG tablet Take 1 tablet (25 mg total) by mouth every 6 (six) hours as needed for nausea or vomiting.  30 tablet 2     Review of Systems  Constitutional: Positive for activity change and appetite change.  Gastrointestinal: Positive for abdominal pain (lower abdominal cramping), nausea and vomiting (10 X per day).  Genitourinary: Positive for vaginal bleeding. Negative for dysuria, flank pain, pelvic pain, urgency and vaginal discharge.  Neurological: Positive for weakness.   Physical Exam   Blood pressure 127/74, pulse (!) 102, temperature 99.1 F (37.3 C), temperature source Oral, resp. rate 18, height 5\' 1"  (1.549 m), weight 166 lb (75.3 kg), last menstrual period 03/30/2017, SpO2 99 %, unknown if currently breastfeeding.  Physical Exam  Nursing note and vitals reviewed. Constitutional: She is oriented to person, place, and time. She appears well-developed.  Looks weary.   HENT:  Head: Normocephalic.  Neck: Normal range of motion.  Respiratory: Effort normal.  GI: There is no tenderness. There is no rebound and no guarding.  Genitourinary: There is no rash, tenderness or lesion on the right labia. There is no rash, tenderness or lesion on the left labia. Uterus is enlarged (8-9 week size) and tender (mildly tender on exam). Right adnexum displays no mass, no tenderness and no fullness. Left adnexum displays no mass, no tenderness and no fullness. No bleeding (neg for signs of bleeding on exam.  Neg for clot) in the vagina. No vaginal discharge found.  Neurological: She is alert and oriented to  person, place, and time.  Skin: Skin is warm and dry.  Psychiatric: Judgment and thought content normal.  Looks and acts as if she doesn't feel well.    Results for orders placed or performed during the hospital encounter of 06/06/17 (from the past 24 hour(s))  Urinalysis, Routine w reflex microscopic     Status: Abnormal   Collection Time: 06/06/17  6:25 PM  Result Value Ref Range   Color, Urine AMBER (A) YELLOW   APPearance HAZY (A) CLEAR   Specific Gravity, Urine 1.029 1.005 - 1.030   pH  5.0 5.0 - 8.0   Glucose, UA NEGATIVE NEGATIVE mg/dL   Hgb urine dipstick NEGATIVE NEGATIVE   Bilirubin Urine MODERATE (A) NEGATIVE   Ketones, ur 80 (A) NEGATIVE mg/dL   Protein, ur 161 (A) NEGATIVE mg/dL   Nitrite NEGATIVE NEGATIVE   Leukocytes, UA TRACE (A) NEGATIVE   RBC / HPF 0-5 0 - 5 RBC/hpf   WBC, UA 6-30 0 - 5 WBC/hpf   Bacteria, UA MANY (A) NONE SEEN   Squamous Epithelial / LPF 6-30 (A) NONE SEEN   Mucus PRESENT    Sperm, UA PRESENT   Comprehensive metabolic panel     Status: Abnormal   Collection Time: 06/06/17  7:36 PM  Result Value Ref Range   Sodium 128 (L) 135 - 145 mmol/L   Potassium 4.0 3.5 - 5.1 mmol/L   Chloride 97 (L) 101 - 111 mmol/L   CO2 19 (L) 22 - 32 mmol/L   Glucose, Bld 74 65 - 99 mg/dL   BUN 7 6 - 20 mg/dL   Creatinine, Ser 0.96 0.44 - 1.00 mg/dL   Calcium 9.3 8.9 - 04.5 mg/dL   Total Protein 7.9 6.5 - 8.1 g/dL   Albumin 3.8 3.5 - 5.0 g/dL   AST 409 (H) 15 - 41 U/L   ALT 165 (H) 14 - 54 U/L   Alkaline Phosphatase 122 38 - 126 U/L   Total Bilirubin 2.4 (H) 0.3 - 1.2 mg/dL   GFR calc non Af Amer >60 >60 mL/min   GFR calc Af Amer >60 >60 mL/min   Anion gap 12 5 - 15   MAU Course  Procedures  MDM MSE Exam Labs U/S IV hydration with Phenergan 25mg .  20:10  Care turned over to [facility]. Mayford Knife, CNM.   Assessment and Plan  A: First trimester pregnancy     Hyperemesis        Eve M Key 06/06/2017, 8:09 PM   Assumed care from prior provider Doing better with fluids and meds No vomiting while here.  US Ob Comp Less 14 Wks  Result Date: 06/06/2017 CLINICAL DATA:  Pregnant patient.  Cramping. EXAM: TWIN OBSTETRICAL ULTRASOUND <14 WKS COMPARISON:  None. FINDINGS: Number of IUPs:  2 Chorionicity/Amnionicity:  Dichorionic-diamniotic (thick membrane) TWIN 1 Yolk sac:  Visualized. Embryo:  Visualized. Cardiac Activity: Visualized. Heart Rate: 170 bpm MSD:   mm    w     d CRL:   23.4 mm   9 w 0 d                  [place] EDC: January 09, 2018 TWIN 2 Yolk  sac:  Visualized. Embryo:  Visualized. Cardiac Activity: Visualized. Heart Rate: 175 bpm MSD:   mm    w     d CRL:   24.2 mm   9 w 1 d  [place] EDC: January 08, 2018 Subchorionic hemorrhage:  None visualized. Maternal uterus/adnexae: The ovaries are normal in appearance. IMPRESSION: Live dichorionic diamniotic intrauterine twin pregnancies. Electronically Signed   By: Gerome Sam III M.D   On: 06/06/2017 21:16   US Ob Comp Addl Gest Less 14 Wks  Result Date: 06/06/2017 CLINICAL DATA:  Pregnant patient.  Cramping. EXAM: TWIN OBSTETRICAL ULTRASOUND <14 WKS COMPARISON:  None. FINDINGS: Number of IUPs:  2 Chorionicity/Amnionicity:  Dichorionic-diamniotic (thick membrane) TWIN 1 Yolk sac:  Visualized. Embryo:  Visualized. Cardiac Activity: Visualized. Heart Rate: 170 bpm MSD:   mm    w     d CRL:   23.4 mm   9 w 0 d                  [place] EDC: January 09, 2018 TWIN 2 Yolk sac:  Visualized. Embryo:  Visualized. Cardiac Activity: Visualized. Heart Rate: 175 bpm MSD:   mm    w     d CRL:   24.2 mm   9 w 1 d                  [place] EDC: January 08, 2018 Subchorionic hemorrhage:  None visualized. Maternal uterus/adnexae: The ovaries are normal in appearance. IMPRESSION: Live dichorionic diamniotic intrauterine twin pregnancies. Electronically Signed   By: Gerome Sam III M.D   On: 06/06/2017 21:16   Discussed twin gestation may be causing incrreased nausea Wanted to go home after IV infused (got a liter of D5LR with Phenergan) Has slept since receiving IV and has had no vomiting.    Results for orders placed or performed during the hospital encounter of 06/06/17 (from the past 24 hour(s))  Urinalysis, Routine w reflex microscopic     Status: Abnormal   Collection Time: 06/06/17  6:25 PM  Result Value Ref Range   Color, Urine AMBER (A) YELLOW   APPearance HAZY (A) CLEAR   Specific Gravity, Urine 1.029 1.005 - 1.030   pH 5.0 5.0 - 8.0   Glucose, UA NEGATIVE NEGATIVE mg/dL   Hgb urine  dipstick NEGATIVE NEGATIVE   Bilirubin Urine MODERATE (A) NEGATIVE   Ketones, ur 80 (A) NEGATIVE mg/dL   Protein, ur 161 (A) NEGATIVE mg/dL   Nitrite NEGATIVE NEGATIVE   Leukocytes, UA TRACE (A) NEGATIVE   RBC / HPF 0-5 0 - 5 RBC/hpf   WBC, UA 6-30 0 - 5 WBC/hpf   Bacteria, UA MANY (A) NONE SEEN   Squamous Epithelial / LPF 6-30 (A) NONE SEEN   Mucus PRESENT    Sperm, UA PRESENT   Comprehensive metabolic panel     Status: Abnormal   Collection Time: 06/06/17  7:36 PM  Result Value Ref Range   Sodium 128 (L) 135 - 145 mmol/L   Potassium 4.0 3.5 - 5.1 mmol/L   Chloride 97 (L) 101 - 111 mmol/L   CO2 19 (L) 22 - 32 mmol/L   Glucose, Bld 74 65 - 99 mg/dL   BUN 7 6 - 20 mg/dL   Creatinine, Ser 0.96 0.44 - 1.00 mg/dL   Calcium 9.3 8.9 - 04.5 mg/dL   Total Protein 7.9 6.5 - 8.1 g/dL   Albumin 3.8 3.5 - 5.0 g/dL   AST 409 (H) 15 - 41 U/L   ALT 165 (H) 14 - 54 U/L   Alkaline Phosphatase 122 38 - 126 U/L   Total Bilirubin 2.4 (H) 0.3 - 1.2 mg/dL   GFR calc non Af Amer >60 >  60 mL/min   GFR calc Af Amer >60 >60 mL/min   Anion gap 12 5 - 15  CBC with Differential/Platelet     Status: Abnormal   Collection Time: 06/06/17  7:36 PM  Result Value Ref Range   WBC 6.2 4.0 - 10.5 K/uL   RBC 5.13 (H) 3.87 - 5.11 MIL/uL   Hemoglobin 12.2 12.0 - 15.0 g/dL   HCT 19.1 (L) 47.8 - 29.5 %   MCV 68.4 (L) 78.0 - 100.0 fL   MCH 23.8 (L) 26.0 - 34.0 pg   MCHC 34.8 30.0 - 36.0 g/dL   RDW 62.1 30.8 - 65.7 %   Platelets 335 150 - 400 K/uL   Neutrophils Relative % 59 %   Lymphocytes Relative 27 %   Monocytes Relative 14 %   Eosinophils Relative 0 %   Basophils Relative 0 %   Other 0 %   Neutro Abs 3.6 1.7 - 7.7 K/uL   Lymphs Abs 1.7 0.7 - 4.0 K/uL   Monocytes Absolute 0.9 0.1 - 1.0 K/uL   Eosinophils Absolute 0.0 0.0 - 0.7 K/uL   Basophils Absolute 0.0 0.0 - 0.1 K/uL   RBC Morphology ROULEAUX    WBC Morphology WHITE COUNT CONFIRMED ON SMEAR     After discharge, labs reviewed and noted to have  elevated LFTs and bilirubin Dr Henderson Cloud notified Would recommend calling patient and bringing her back in for repeat CMET and RUQ ultrasound Attempted to call patient several times during the night, with no answer and unable to leave message Will try calling other contact number.  Aviva Signs, CNM

## 2017-06-06 NOTE — MAU Note (Signed)
End of shift report given to GrenadaBrittany, Charity fundraiserN.

## 2017-06-06 NOTE — MAU Note (Signed)
Pt presents with c/o vomiting, abdominal cramping, and headache that began a couple weeks ago.  Reports unable to keep anything down.  States given "nausea medicine" but it's not working.  Reports vomited 10-15 times in past 24 hours.

## 2017-06-07 LAB — HIV ANTIBODY (ROUTINE TESTING W REFLEX): HIV Screen 4th Generation wRfx: NONREACTIVE

## 2017-06-09 LAB — GC/CHLAMYDIA PROBE AMP (~~LOC~~) NOT AT ARMC
Chlamydia: NEGATIVE
Neisseria Gonorrhea: NEGATIVE

## 2017-06-12 ENCOUNTER — Inpatient Hospital Stay (HOSPITAL_COMMUNITY)
Admission: AD | Admit: 2017-06-12 | Discharge: 2017-06-12 | Disposition: A | Discharge status: Home / Self Care | Payer: Medicaid Other | Source: Ambulatory Visit | Attending: Obstetrics and Gynecology | Admitting: Obstetrics and Gynecology

## 2017-06-12 DIAGNOSIS — O99611 Diseases of the digestive system complicating pregnancy, first trimester: Secondary | ICD-10-CM | POA: Insufficient documentation

## 2017-06-12 DIAGNOSIS — O34219 Maternal care for unspecified type scar from previous cesarean delivery: Secondary | ICD-10-CM | POA: Insufficient documentation

## 2017-06-12 DIAGNOSIS — O26891 Other specified pregnancy related conditions, first trimester: Secondary | ICD-10-CM | POA: Diagnosis not present

## 2017-06-12 DIAGNOSIS — O210 Mild hyperemesis gravidarum: Secondary | ICD-10-CM | POA: Insufficient documentation

## 2017-06-12 DIAGNOSIS — R51 Headache: Secondary | ICD-10-CM | POA: Diagnosis not present

## 2017-06-12 DIAGNOSIS — O30001 Twin pregnancy, unspecified number of placenta and unspecified number of amniotic sacs, first trimester: Secondary | ICD-10-CM | POA: Diagnosis not present

## 2017-06-12 DIAGNOSIS — E860 Dehydration: Secondary | ICD-10-CM

## 2017-06-12 DIAGNOSIS — K219 Gastro-esophageal reflux disease without esophagitis: Secondary | ICD-10-CM | POA: Diagnosis not present

## 2017-06-12 DIAGNOSIS — R7989 Other specified abnormal findings of blood chemistry: Secondary | ICD-10-CM | POA: Insufficient documentation

## 2017-06-12 DIAGNOSIS — Z3A10 10 weeks gestation of pregnancy: Secondary | ICD-10-CM | POA: Diagnosis not present

## 2017-06-12 DIAGNOSIS — R634 Abnormal weight loss: Secondary | ICD-10-CM | POA: Diagnosis not present

## 2017-06-12 DIAGNOSIS — Z79899 Other long term (current) drug therapy: Secondary | ICD-10-CM | POA: Insufficient documentation

## 2017-06-12 DIAGNOSIS — R55 Syncope and collapse: Secondary | ICD-10-CM | POA: Diagnosis not present

## 2017-06-12 DIAGNOSIS — R748 Abnormal levels of other serum enzymes: Secondary | ICD-10-CM | POA: Diagnosis present

## 2017-06-12 LAB — URINALYSIS, ROUTINE W REFLEX MICROSCOPIC
Glucose, UA: NEGATIVE mg/dL
Hgb urine dipstick: NEGATIVE
Ketones, ur: 80 mg/dL — AB
Nitrite: NEGATIVE
Protein, ur: 100 mg/dL — AB
Specific Gravity, Urine: 1.027 (ref 1.005–1.030)
pH: 5 (ref 5.0–8.0)

## 2017-06-12 LAB — COMPREHENSIVE METABOLIC PANEL
ALBUMIN: 3.7 g/dL (ref 3.5–5.0)
ALK PHOS: 102 U/L (ref 38–126)
ALT: 104 U/L — ABNORMAL HIGH (ref 14–54)
AST: 67 U/L — AB (ref 15–41)
Anion gap: 10 (ref 5–15)
BILIRUBIN TOTAL: 1.3 mg/dL — AB (ref 0.3–1.2)
CALCIUM: 9.5 mg/dL (ref 8.9–10.3)
CO2: 22 mmol/L (ref 22–32)
Chloride: 101 mmol/L (ref 101–111)
Creatinine, Ser: 0.48 mg/dL (ref 0.44–1.00)
GFR calc Af Amer: >60 mL/min (ref >60)
GFR calc non Af Amer: >60 mL/min (ref >60)
GLUCOSE: 93 mg/dL (ref 65–99)
POTASSIUM: 3.6 mmol/L (ref 3.5–5.1)
Sodium: 133 mmol/L — ABNORMAL LOW (ref 135–145)
Total Protein: 7.6 g/dL (ref 6.5–8.1)

## 2017-06-12 LAB — CBC
HCT: 37 % (ref 36.0–46.0)
HEMOGLOBIN: 12.6 g/dL (ref 12.0–15.0)
MCH: 24 pg — ABNORMAL LOW (ref 26.0–34.0)
MCHC: 34.1 g/dL (ref 30.0–36.0)
MCV: 70.5 fL — ABNORMAL LOW (ref 78.0–100.0)
Platelets: 302 10*3/uL (ref 150–400)
RBC: 5.25 MIL/uL — ABNORMAL HIGH (ref 3.87–5.11)
RDW: 14.2 % (ref 11.5–15.5)
WBC: 6.9 10*3/uL (ref 4.0–10.5)

## 2017-06-12 LAB — LIPASE, BLOOD: Lipase: 31 U/L (ref 11–51)

## 2017-06-12 LAB — AMYLASE: Amylase: 76 U/L (ref 28–100)

## 2017-06-12 MED ORDER — ONDANSETRON 4 MG PO TBDP: 4.0000 mg | ORAL_TABLET | Freq: Four times a day (QID) | ORAL | 0 refills | Status: DC | PRN

## 2017-06-12 MED ORDER — ONDANSETRON 4 MG PO TBDP
4.0000 mg | ORAL_TABLET | Freq: Once | ORAL | Status: AC
  Administered 2017-06-12: 4 mg via ORAL
  Filled 2017-06-12: qty 1

## 2017-06-12 MED ORDER — LACTATED RINGERS IV SOLN
INTRAVENOUS | Status: DC
  Administered 2017-06-12: 21:00:00 via INTRAVENOUS

## 2017-06-12 NOTE — MAU Note (Addendum)
Pt has vomited 3 times today. Hasn't been able to eat since last Saturday. Ate one orange and threw up. Was at work today; felt really tired and was told to go lay down. Woke up to EMS arriving to take her to hospital as her coworkers reported her being "unresponsive."  Tried Phenergan but says it doesn't work. Not taking Pepcid regularly but has reflux. Has headache but says Tylenol doesn't help. Also believes she has UTI. Blood sugar was on low side per EMS.

## 2017-06-12 NOTE — MAU Provider Note (Signed)
Chief Complaint: Hyperemesis Gravidarum   First Provider Initiated Contact with Patient 06/12/17 2027        SUBJECTIVE HPI: Miranda James is a 22 y.o. G3P1011 at [redacted]w[redacted]d by LMP who presents to maternity admissions reporting near syncope at work today. Has been struggling with nausea and vomting.  Has a twin gestation.  Was seen here on 06/06/17 and Pepcid was added to her Phenergan for relief.  States has not been able to keep anything down.  States has not eaten in several days. . She denies vaginal bleeding, vaginal itching/burning, urinary symptoms, h/a, dizziness, or fever/chills.    LFTs and Bilirubin were elevated on last visit. We were unable to contact her for followup labs.   Emesis   This is a recurrent problem. The current episode started 1 to 4 weeks ago. The problem occurs 5 to 10 times per day. The problem has been unchanged. There has been no fever. Associated symptoms include weight loss. Pertinent negatives include no abdominal pain, chills, diarrhea, dizziness, fever or headaches.    RN Note: Pt has vomited 3 times today. Hasn't been able to eat since last Saturday. Ate one orange and threw up. Was at work today; felt really tired and was told to go lay down. Woke up to EMS arriving to take her to hospital as her coworkers reported her being "unresponsive."  Tried Phenergan but says it doesn't work. Not taking Pepcid regularly but has reflux. Has headache but says Tylenol doesn't help. Also believes she has UTI. Blood sugar was on low side per EMS.     Past Medical History:  Diagnosis Date  . Bacterial vaginitis 09/25/2016  . Medical history non-contributory    Past Surgical History:  Procedure Laterality Date  . CESAREAN SECTION N/A 12/15/2016   Procedure: CESAREAN SECTION;  Surgeon: Essie Hart, MD;  Location: [facility] BIRTHING SU[facility];  Service: Obstetrics;  Laterality: N/A;  . NO PAST SURGERIES     Social History   Socioeconomic History  . Marital status: Single   Spouse name: Not on file  . Number of children: Not on file  . Years of education: Not on file  . Highest education level: Not on file  Social Needs  . Financial resource strain: Not on file  . Food insecurity - worry: Not on file  . Food insecurity - inability: Not on file  . Transportation needs - medical: Not on file  . Transportation needs - non-medical: Not on file  Occupational History  . Not on file  Tobacco Use  . Smoking status: Never Smoker  . Smokeless tobacco: Never Used  Substance and Sexual Activity  . Alcohol use: No  . Drug use: No  . Sexual activity: Yes  Other Topics Concern  . Not on file  Social History Narrative  . Not on file   No current facility-administered medications on file prior to encounter.    Current Outpatient Medications on File Prior to Encounter  Medication Sig Dispense Refill  . promethazine (PHENERGAN) 25 MG tablet Take 1 tablet (25 mg total) by mouth every 6 (six) hours as needed for nausea or vomiting. 30 tablet 2  . famotidine (PEPCID) 20 MG tablet Take 1 tablet (20 mg total) by mouth 2 (two) times daily. 60 tablet 2  . Prenatal Vit-Fe Fumarate-FA (PRENATAL MULTIVITAMIN) TABS tablet Take 1 tablet by mouth daily at 12 noon.     No Known Allergies  I have reviewed patient's Past Medical Hx, Surgical Hx, Family Hx, Social  Hx, medications and allergies.   ROS:  Review of Systems  Constitutional: Positive for weight loss. Negative for chills and fever.  Gastrointestinal: Positive for vomiting. Negative for abdominal pain and diarrhea.  Neurological: Negative for dizziness and headaches.   Review of Systems  Other systems negative   Physical Exam  Physical Exam Patient Vitals for the past 24 hrs:  BP Temp Temp src Pulse Resp SpO2  06/12/17 1900 (!) 126/56 98.7 F (37.1 C) Oral 88 18 100 %   Weight was 166lbs on 06/06/17 Weight is 164 today  Constitutional: Well-developed female in no acute distress.  Cardiovascular: normal  rate, no tachycardia Respiratory: normal effort GI: Abd soft, non-tender. Pos BS x 4 MS: Extremities nontender, no edema, normal ROM Neurologic: Alert and oriented x 4.  GU: Neg CVAT.  PELVIC EXAM: deferred  LAB RESULTS Results for orders placed or performed during the hospital encounter of 06/12/17 (from the past 24 hour(s))  Urinalysis, Routine w reflex microscopic     Status: Abnormal   Collection Time: 06/12/17  7:14 PM  Result Value Ref Range   Color, Urine AMBER (A) YELLOW   APPearance CLOUDY (A) CLEAR   Specific Gravity, Urine 1.027 1.005 - 1.030   pH 5.0 5.0 - 8.0   Glucose, UA NEGATIVE NEGATIVE mg/dL   Hgb urine dipstick NEGATIVE NEGATIVE   Bilirubin Urine SMALL (A) NEGATIVE   Ketones, ur 80 (A) NEGATIVE mg/dL   Protein, ur 161 (A) NEGATIVE mg/dL   Nitrite NEGATIVE NEGATIVE   Leukocytes, UA SMALL (A) NEGATIVE   RBC / HPF 0-5 0 - 5 RBC/hpf   WBC, UA 6-30 0 - 5 WBC/hpf   Bacteria, UA MANY (A) NONE SEEN   Squamous Epithelial / LPF 6-30 (A) NONE SEEN   Mucus PRESENT   CBC     Status: Abnormal   Collection Time: 06/12/17  8:30 PM  Result Value Ref Range   WBC 6.9 4.0 - 10.5 K/uL   RBC 5.25 (H) 3.87 - 5.11 MIL/uL   Hemoglobin 12.6 12.0 - 15.0 g/dL   HCT 09.6 04.5 - 40.9 %   MCV 70.5 (L) 78.0 - 100.0 fL   MCH 24.0 (L) 26.0 - 34.0 pg   MCHC 34.1 30.0 - 36.0 g/dL   RDW 81.1 91.4 - 78.2 %   Platelets 302 150 - 400 K/uL  Comprehensive metabolic panel     Status: Abnormal   Collection Time: 06/12/17  8:30 PM  Result Value Ref Range   Sodium 133 (L) 135 - 145 mmol/L   Potassium 3.6 3.5 - 5.1 mmol/L   Chloride 101 101 - 111 mmol/L   CO2 22 22 - 32 mmol/L   Glucose, Bld 93 65 - 99 mg/dL   BUN <5 (L) 6 - 20 mg/dL   Creatinine, Ser 9.56 0.44 - 1.00 mg/dL   Calcium 9.5 8.9 - 21.3 mg/dL   Total Protein 7.6 6.5 - 8.1 g/dL   Albumin 3.7 3.5 - 5.0 g/dL   AST 67 (H) 15 - 41 U/L   ALT 104 (H) 14 - 54 U/L   Alkaline Phosphatase 102 38 - 126 U/L   Total Bilirubin 1.3 (H) 0.3  - 1.2 mg/dL   GFR calc non Af Amer >60 >60 mL/min   GFR calc Af Amer >60 >60 mL/min   Anion gap 10 5 - 15  Lipase, blood     Status: None   Collection Time: 06/12/17  8:30 PM  Result Value Ref Range  Lipase 31 11 - 51 U/L  Amylase     Status: None   Collection Time: 06/12/17  8:30 PM  Result Value Ref Range   Amylase 76 28 - 100 U/L     Ref. Range 06/06/2017 19:36  AST Latest Ref Range: 15 - 41 U/L 108 (H)  ALT Latest Ref Range: 14 - 54 U/L 165 (H)   --/--/O POS, O POS (08/25 1540)  IMAGING [place] Ob Comp Less 14 Wks  Result Date: 06/06/2017 CLINICAL DATA:  Pregnant patient.  Cramping. EXAM: TWIN OBSTETRICAL ULTRASOUND <14 WKS COMPARISON:  None. FINDINGS: Number of IUPs:  2 Chorionicity/Amnionicity:  Dichorionic-diamniotic (thick membrane) TWIN 1 Yolk sac:  Visualized. Embryo:  Visualized. Cardiac Activity: Visualized. Heart Rate: 170 bpm MSD:   mm    w     d CRL:   23.4 mm   9 w 0 d                  [place] EDC: January 09, 2018 TWIN 2 Yolk sac:  Visualized. Embryo:  Visualized. Cardiac Activity: Visualized. Heart Rate: 175 bpm MSD:   mm    w     d CRL:   24.2 mm   9 w 1 d                  [place] EDC: January 08, 2018 Subchorionic hemorrhage:  None visualized. Maternal uterus/adnexae: The ovaries are normal in appearance. IMPRESSION: Live dichorionic diamniotic intrauterine twin pregnancies. Electronically Signed   By: Gerome Samavid  Aveleen Nevers III M.D   On: 06/06/2017 21:16   [place] Ob Comp Addl Gest Less 14 Wks  Result Date: 06/06/2017 CLINICAL DATA:  Pregnant patient.  Cramping. EXAM: TWIN OBSTETRICAL ULTRASOUND <14 WKS COMPARISON:  None. FINDINGS: Number of IUPs:  2 Chorionicity/Amnionicity:  Dichorionic-diamniotic (thick membrane) TWIN 1 Yolk sac:  Visualized. Embryo:  Visualized. Cardiac Activity: Visualized. Heart Rate: 170 bpm MSD:   mm    w     d CRL:   23.4 mm   9 w 0 d                  [place] EDC: January 09, 2018 TWIN 2 Yolk sac:  Visualized. Embryo:  Visualized. Cardiac Activity: Visualized. Heart  Rate: 175 bpm MSD:   mm    w     d CRL:   24.2 mm   9 w 1 d                  [place] EDC: January 08, 2018 Subchorionic hemorrhage:  None visualized. Maternal uterus/adnexae: The ovaries are normal in appearance. IMPRESSION: Live dichorionic diamniotic intrauterine twin pregnancies. Electronically Signed   By: Gerome Samavid  Jarrod Bodkins III M.D   On: 06/06/2017 21:16    MAU Management/MDM: Ordered one liter of LR for rehydration.  Zofran given with good relief of nausea After first liter I recommended a second liter Patient told RN she needed to leave to let her FOB get to work Refused second liter.  Reviewed improvement of LFTs, recommendations for advancing diet Will add Zofran to regimen.   ASSESSMENT Twin IUP at 6478w0d Hyperemesis, with weight loss No vomiting while here Elevated liver function tests, improved since a week ago  PLAN Discharge home Rx Zofran ODT for nausea Recommend trying Phenergan first Advance diet slowly.  Small bites and sips at first  Pt stable at time of discharge. Encouraged to return here or to other Urgent Care/ED if she develops worsening of symptoms,  increase in pain, fever, or other concerning symptoms.    Wynelle Bourgeois CNM, MSN Certified Nurse-Midwife 06/12/2017  8:28 PM

## 2017-06-12 NOTE — Discharge Instructions (Signed)
Ondansetron tablets What is this medicine? ONDANSETRON (on DAN se tron) is used to treat nausea and vomiting caused by chemotherapy. It is also used to prevent or treat nausea and vomiting after surgery. This medicine may be used for other purposes; ask your health care provider or pharmacist if you have questions. COMMON BRAND NAME(S): Zofran What should I tell my health care provider before I take this medicine? They need to know if you have any of these conditions: -heart disease -history of irregular heartbeat -liver disease -low levels of magnesium or potassium in the blood -an unusual or allergic reaction to ondansetron, granisetron, other medicines, foods, dyes, or preservatives -pregnant or trying to get pregnant -breast-feeding How should I use this medicine? Take this medicine by mouth with a glass of water. Follow the directions on your prescription label. Take your doses at regular intervals. Do not take your medicine more often than directed. Talk to your pediatrician regarding the use of this medicine in children. Special care may be needed. Overdosage: If you think you have taken too much of this medicine contact a poison control center or emergency room at once. NOTE: This medicine is only for you. Do not share this medicine with others. What if I miss a dose? If you miss a dose, take it as soon as you can. If it is almost time for your next dose, take only that dose. Do not take double or extra doses. What may interact with this medicine? Do not take this medicine with any of the following medications: -apomorphine -certain medicines for fungal infections like fluconazole, itraconazole, ketoconazole, posaconazole, voriconazole -cisapride -dofetilide -dronedarone -pimozide -thioridazine -ziprasidone This medicine may also interact with the following medications: -carbamazepine -certain medicines for depression, anxiety, or psychotic  disturbances -fentanyl -linezolid -MAOIs like Carbex, Eldepryl, Marplan, Nardil, and Parnate -methylene blue (injected into a vein) -other medicines that prolong the QT interval (cause an abnormal heart rhythm) -phenytoin -rifampicin -tramadol This list may not describe all possible interactions. Give your health care provider a list of all the medicines, herbs, non-prescription drugs, or dietary supplements you use. Also tell them if you smoke, drink alcohol, or use illegal drugs. Some items may interact with your medicine. What should I watch for while using this medicine? Check with your doctor or health care professional right away if you have any sign of an allergic reaction. What side effects may I notice from receiving this medicine? Side effects that you should report to your doctor or health care professional as soon as possible: -allergic reactions like skin rash, itching or hives, swelling of the face, lips or tongue -breathing problems -confusion -dizziness -fast or irregular heartbeat -feeling faint or lightheaded, falls -fever and chills -loss of balance or coordination -seizures -sweating -swelling of the hands or feet -tightness in the chest -tremors -unusually weak or tired Side effects that usually do not require medical attention (report to your doctor or health care professional if they continue or are bothersome): -constipation or diarrhea -headache This list may not describe all possible side effects. Call your doctor for medical advice about side effects. You may report side effects to FDA at 1-800-FDA-1088. Where should I keep my medicine? Keep out of the reach of children. Store between 2 and 30 degrees C (36 and 86 degrees F). Throw away any unused medicine after the expiration date. NOTE: This sheet is a summary. It may not cover all possible information. If you have questions about this medicine, talk to your doctor, pharmacist,  or health care  provider.  2018 Elsevier/Gold Standard (2013-01-13 16:27:45) Hyperemesis Gravidarum Hyperemesis gravidarum is a severe form of nausea and vomiting that happens during pregnancy. Hyperemesis is worse than morning sickness. It may cause you to have nausea or vomiting all day for many days. It may keep you from eating and drinking enough food and liquids. Hyperemesis usually occurs during the first half (the first 20 weeks) of pregnancy. It often goes away once a woman is in her second half of pregnancy. However, sometimes hyperemesis continues through an entire pregnancy. What are the causes? The cause of this condition is not known. It may be related to changes in chemicals (hormones) in the body during pregnancy, such as the high level of pregnancy hormone (human chorionic gonadotropin) or the increase in the female sex hormone (estrogen). What are the signs or symptoms? Symptoms of this condition include:  Severe nausea and vomiting.  Nausea that does not go away.  Vomiting that does not allow you to keep any food down.  Weight loss.  Body fluid loss (dehydration).  Having no desire to eat, or not liking food that you have previously enjoyed.  How is this diagnosed? This condition may be diagnosed based on:  A physical exam.  Your medical history.  Your symptoms.  Blood tests.  Urine tests.  How is this treated? This condition may be managed with medicine. If medicines to do not help relieve nausea and vomiting, you may need to receive fluids through an IV tube at the hospital. Follow these instructions at home:  Take over-the-counter and prescription medicines only as told by your health care provider.  Avoid iron pills and multivitamins that contain iron for the first 3-4 months of pregnancy. If you take prescription iron pills, do not stop taking them unless your health care provider approves.  Take the following actions to help prevent nausea and vomiting: ? In the  morning, before getting out of bed, try eating a couple of dry crackers or a piece of toast. ? Avoid foods and smells that upset your stomach. Fatty and spicy foods may make nausea worse. ? Eat 5-6 small meals a day. ? Do not drink fluids while eating meals. Drink between meals. ? Eat or suck on things that have ginger in them. Ginger can help relieve nausea. ? Avoid food preparation. The smell of food can spoil your appetite or trigger nausea.  Follow instructions from your health care provider about eating or drinking restrictions.  For snacks, eat high-protein foods, such as cheese.  Keep all follow-up and pre-birth (prenatal) visits as told by your health care provider. This is important. Contact a health care provider if:  You have pain in your abdomen.  You have a severe headache.  You have vision problems.  You are losing weight. Get help right away if:  You cannot drink fluids without vomiting.  You vomit blood.  You have constant nausea and vomiting.  You are very weak.  You are very thirsty.  You feel dizzy.  You faint.  You have a fever or other symptoms that last for more than 2-3 days.  You have a fever and your symptoms suddenly get worse. Summary  Hyperemesis gravidarum is a severe form of nausea and vomiting that happens during pregnancy.  Making some changes to your eating habits may help relieve nausea and vomiting.  This condition may be managed with medicine.  If medicines to do not help relieve nausea and vomiting, you may need  to receive fluids through an IV tube at the hospital. This information is not intended to replace advice given to you by your health care provider. Make sure you discuss any questions you have with your health care provider. Document Released: 04/08/2005 Document Revised: 12/06/2015 Document Reviewed: 12/06/2015 Elsevier Interactive Patient Education  2017 [employer]

## 2017-06-13 LAB — HEPATITIS B SURFACE ANTIGEN: HEP B S AG: NEGATIVE

## 2017-06-13 LAB — HEPATITIS C ANTIBODY: HCV Ab: 0.1 s/co ratio (ref 0.0–0.9)

## 2017-06-18 ENCOUNTER — Inpatient Hospital Stay (HOSPITAL_COMMUNITY)
Admission: AD | Admit: 2017-06-18 | Discharge: 2017-06-19 | Disposition: A | Discharge status: Home / Self Care | Payer: Medicaid Other | Source: Ambulatory Visit | Attending: Obstetrics & Gynecology | Admitting: Obstetrics & Gynecology

## 2017-06-18 ENCOUNTER — Encounter (HOSPITAL_COMMUNITY): Payer: Self-pay

## 2017-06-18 DIAGNOSIS — K226 Gastro-esophageal laceration-hemorrhage syndrome: Secondary | ICD-10-CM | POA: Diagnosis not present

## 2017-06-18 DIAGNOSIS — O219 Vomiting of pregnancy, unspecified: Secondary | ICD-10-CM

## 2017-06-18 DIAGNOSIS — O210 Mild hyperemesis gravidarum: Secondary | ICD-10-CM | POA: Insufficient documentation

## 2017-06-18 DIAGNOSIS — Z3A10 10 weeks gestation of pregnancy: Secondary | ICD-10-CM | POA: Insufficient documentation

## 2017-06-18 DIAGNOSIS — K209 Esophagitis, unspecified: Secondary | ICD-10-CM | POA: Diagnosis not present

## 2017-06-18 DIAGNOSIS — O30041 Twin pregnancy, dichorionic/diamniotic, first trimester: Secondary | ICD-10-CM

## 2017-06-18 DIAGNOSIS — O34219 Maternal care for unspecified type scar from previous cesarean delivery: Secondary | ICD-10-CM | POA: Diagnosis not present

## 2017-06-18 DIAGNOSIS — R740 Nonspecific elevation of levels of transaminase and lactic acid dehydrogenase [LDH]: Secondary | ICD-10-CM | POA: Insufficient documentation

## 2017-06-18 DIAGNOSIS — K920 Hematemesis: Secondary | ICD-10-CM

## 2017-06-18 DIAGNOSIS — O26891 Other specified pregnancy related conditions, first trimester: Secondary | ICD-10-CM | POA: Insufficient documentation

## 2017-06-18 LAB — CBC WITH DIFFERENTIAL/PLATELET
Basophils Absolute: 0 10*3/uL (ref 0.0–0.1)
Basophils Relative: 0 %
EOS ABS: 0 10*3/uL (ref 0.0–0.7)
Eosinophils Relative: 0 %
HCT: 37 % (ref 36.0–46.0)
Hemoglobin: 12.7 g/dL (ref 12.0–15.0)
LYMPHS PCT: 25 %
Lymphs Abs: 1.6 10*3/uL (ref 0.7–4.0)
MCH: 24.3 pg — AB (ref 26.0–34.0)
MCHC: 34.3 g/dL (ref 30.0–36.0)
MCV: 70.7 fL — ABNORMAL LOW (ref 78.0–100.0)
MONOS PCT: 7 %
Monocytes Absolute: 0.4 10*3/uL (ref 0.1–1.0)
NEUTROS ABS: 4.4 10*3/uL (ref 1.7–7.7)
NEUTROS PCT: 68 %
Other: 0 %
PLATELETS: 313 10*3/uL (ref 150–400)
RBC: 5.23 MIL/uL — AB (ref 3.87–5.11)
RDW: 14.6 % (ref 11.5–15.5)
WBC: 6.4 10*3/uL (ref 4.0–10.5)

## 2017-06-18 LAB — URINALYSIS, ROUTINE W REFLEX MICROSCOPIC
Glucose, UA: NEGATIVE mg/dL
Hgb urine dipstick: NEGATIVE
Ketones, ur: 80 mg/dL — AB
Nitrite: NEGATIVE
Protein, ur: 100 mg/dL — AB
Specific Gravity, Urine: 1.03 (ref 1.005–1.030)
pH: 6 (ref 5.0–8.0)

## 2017-06-18 LAB — COMPREHENSIVE METABOLIC PANEL
ALT: 70 U/L — AB (ref 14–54)
AST: 51 U/L — ABNORMAL HIGH (ref 15–41)
Albumin: 3.7 g/dL (ref 3.5–5.0)
Alkaline Phosphatase: 104 U/L (ref 38–126)
Anion gap: 11 (ref 5–15)
BUN: 5 mg/dL — ABNORMAL LOW (ref 6–20)
CHLORIDE: 102 mmol/L (ref 101–111)
CO2: 20 mmol/L — AB (ref 22–32)
CREATININE: 0.5 mg/dL (ref 0.44–1.00)
Calcium: 9.4 mg/dL (ref 8.9–10.3)
GFR calc non Af Amer: >60 mL/min (ref >60)
Glucose, Bld: 72 mg/dL (ref 65–99)
Potassium: 4.3 mmol/L (ref 3.5–5.1)
SODIUM: 133 mmol/L — AB (ref 135–145)
Total Bilirubin: 1.5 mg/dL — ABNORMAL HIGH (ref 0.3–1.2)
Total Protein: 7.5 g/dL (ref 6.5–8.1)

## 2017-06-18 MED ORDER — PROMETHAZINE HCL 25 MG/ML IJ SOLN
12.5000 mg | Freq: Once | INTRAMUSCULAR | Status: AC
  Administered 2017-06-18: 12.5 mg via INTRAVENOUS
  Filled 2017-06-18: qty 1

## 2017-06-18 MED ORDER — DEXTROSE 5 % IN LACTATED RINGERS IV BOLUS
1000.0000 mL | Freq: Once | INTRAVENOUS | Status: AC
  Administered 2017-06-18: 1000 mL via INTRAVENOUS

## 2017-06-18 MED ORDER — M.V.I. ADULT IV INJ
Freq: Once | INTRAVENOUS | Status: AC
  Administered 2017-06-19: via INTRAVENOUS
  Filled 2017-06-18: qty 10

## 2017-06-18 MED ORDER — FAMOTIDINE IN NACL 20-0.9 MG/50ML-% IV SOLN
20.0000 mg | Freq: Once | INTRAVENOUS | Status: AC
  Administered 2017-06-18: 20 mg via INTRAVENOUS
  Filled 2017-06-18: qty 50

## 2017-06-18 MED ORDER — LACTATED RINGERS IV SOLN
INTRAVENOUS | Status: DC
  Administered 2017-06-18: 22:00:00 via INTRAVENOUS

## 2017-06-18 NOTE — MAU Note (Signed)
Pt states she has been vomiting blood. States it is sometime light red and sometimes dark red. Pt also reports abdominal pain and diarrhea. Pt states she takes zofran and another medication-last took today around 4pm. Has not helped. States she has not been able to eat at all today. Pt states she has vomited 5 times today with 3 episodes of watery diarrhea. Pt denies fever or sick contacts.

## 2017-06-18 NOTE — MAU Provider Note (Signed)
Chief Complaint: Emesis   First Provider Initiated Contact with Patient 06/18/17 2044        SUBJECTIVE HPI: Miranda James is a 22 y.o. G3P1011 at [redacted]w[redacted]d by LMP who presents to maternity admissions reporting vomiting and diarrhea.  States vomitus has frank blood in it today.   Has been having vomiting entire pregnancy, cannot keep anything but some water down.  Had 5 bouts of emesis today.  Had 3 loose stools today and 3 yesterday. No fever.  Using Pepcid and Zofran, last at 4pm She denies vaginal bleeding, vaginal itching/burning, urinary symptoms, h/a, dizziness, n/v, or fever/chills.    Has not been to or called [facility]  Will manage as unassigned   Thinking of abortion.  Had persistent vomiting last pregnancy also.  LFTs and bilirubin were elevated on first visit to 108/165/2.4  >> 67/104/1.3, likely due to dehydration and weight loss.    Weight 06/06/17 = 166             06/12/17  = 164             06/18/17 = 162      (4lb weight loss in 12 days)   Emesis   This is a recurrent problem. The current episode started 1 to 4 weeks ago. The problem occurs 5 to 10 times per day. The problem has been unchanged. There has been no fever. Associated symptoms include diarrhea and weight loss (2lbs). Pertinent negatives include no abdominal pain, chest pain, chills, fever or myalgias. Treatments tried: zofran and pepcid. The treatment provided no relief.    RN Note: Pt states she has been vomiting blood. States it is sometime light red and sometimes dark red. Pt also reports abdominal pain and diarrhea. Pt states she takes zofran and another medication-last took today around 4pm. Has not helped. States she has not been able to eat at all today. Pt states she has vomited 5 times today with 3 episodes of watery diarrhea. Pt denies fever or sick contacts.    Past Medical History:  Diagnosis Date  . Bacterial vaginitis 09/25/2016  . Medical history non-contributory    Past Surgical History:   Procedure Laterality Date  . CESAREAN SECTION N/A 12/15/2016   Procedure: CESAREAN SECTION;  Surgeon: Essie Hart, MD;  Location: [facility] BIRTHING SUITES;  Service: Obstetrics;  Laterality: N/A;  . NO PAST SURGERIES     Social History   Socioeconomic History  . Marital status: Single    Spouse name: Not on file  . Number of children: Not on file  . Years of education: Not on file  . Highest education level: Not on file  Social Needs  . Financial resource strain: Not on file  . Food insecurity - worry: Not on file  . Food insecurity - inability: Not on file  . Transportation needs - medical: Not on file  . Transportation needs - non-medical: Not on file  Occupational History  . Not on file  Tobacco Use  . Smoking status: Never Smoker  . Smokeless tobacco: Never Used  Substance and Sexual Activity  . Alcohol use: No  . Drug use: No  . Sexual activity: Yes  Other Topics Concern  . Not on file  Social History Narrative  . Not on file   No current facility-administered medications on file prior to encounter.    Current Outpatient Medications on File Prior to Encounter  Medication Sig Dispense Refill  . famotidine (PEPCID) 20 MG tablet Take 1 tablet (  20 mg total) by mouth 2 (two) times daily. 60 tablet 2  . ondansetron (ZOFRAN ODT) 4 MG disintegrating tablet Take 1 tablet (4 mg total) by mouth every 6 (six) hours as needed for nausea. 20 tablet 0  . Prenatal Vit-Fe Fumarate-FA (PRENATAL MULTIVITAMIN) TABS tablet Take 1 tablet by mouth daily at 12 noon.    . promethazine (PHENERGAN) 25 MG tablet Take 1 tablet (25 mg total) by mouth every 6 (six) hours as needed for nausea or vomiting. 30 tablet 2   No Known Allergies  I have reviewed patient's Past Medical Hx, Surgical Hx, Family Hx, Social Hx, medications and allergies.   ROS:  Review of Systems  Constitutional: Positive for weight loss (2lbs). Negative for chills and fever.  Cardiovascular: Negative for chest pain.   Gastrointestinal: Positive for diarrhea and vomiting. Negative for abdominal pain.  Musculoskeletal: Negative for myalgias.   Review of Systems  Other systems negative   Physical Exam  Physical Exam Patient Vitals for the past 24 hrs:  BP Temp Pulse Resp SpO2 Height Weight  06/18/17 2023 123/63 98.6 F (37 C) 86 18 100 % 5\' 1"  (1.549 m) 162 lb (73.5 kg)   Constitutional: Well-developed female in no acute distress, but appears ill. .  Cardiovascular: normal rate Respiratory: normal effort GI: Abd soft, mildly tender. Pos BS x 4 MS: Extremities nontender, no edema, normal ROM Neurologic: Alert and oriented x 4.  GU: Neg CVAT.  PELVIC EXAM: Deferred  FHT 150/160 by bedside US.  Two gestational sacs seen with one baby in each sac. Normal appearing for 10 wks, + FHR x 2  LAB RESULTS Results for orders placed or performed during the hospital encounter of 06/18/17 (from the past 24 hour(s))  Urinalysis, Routine w reflex microscopic     Status: Abnormal   Collection Time: 06/18/17  8:26 PM  Result Value Ref Range   Color, Urine AMBER (A) YELLOW   APPearance HAZY (A) CLEAR   Specific Gravity, Urine 1.030 1.005 - 1.030   pH 6.0 5.0 - 8.0   Glucose, UA NEGATIVE NEGATIVE mg/dL   Hgb urine dipstick NEGATIVE NEGATIVE   Bilirubin Urine SMALL (A) NEGATIVE   Ketones, ur 80 (A) NEGATIVE mg/dL   Protein, ur 161 (A) NEGATIVE mg/dL   Nitrite NEGATIVE NEGATIVE   Leukocytes, UA TRACE (A) NEGATIVE   RBC / HPF 0-5 0 - 5 RBC/hpf   WBC, UA 6-30 0 - 5 WBC/hpf   Bacteria, UA FEW (A) NONE SEEN   Squamous Epithelial / LPF 6-30 (A) NONE SEEN   Mucus PRESENT   CBC with Differential/Platelet     Status: Abnormal   Collection Time: 06/18/17  9:35 PM  Result Value Ref Range   WBC 6.4 4.0 - 10.5 K/uL   RBC 5.23 (H) 3.87 - 5.11 MIL/uL   Hemoglobin 12.7 12.0 - 15.0 g/dL   HCT 09.6 04.5 - 40.9 %   MCV 70.7 (L) 78.0 - 100.0 fL   MCH 24.3 (L) 26.0 - 34.0 pg   MCHC 34.3 30.0 - 36.0 g/dL   RDW 81.1  91.4 - 78.2 %   Platelets 313 150 - 400 K/uL   Neutrophils Relative % 68 %   Lymphocytes Relative 25 %   Monocytes Relative 7 %   Eosinophils Relative 0 %   Basophils Relative 0 %   Other 0 %   Neutro Abs 4.4 1.7 - 7.7 K/uL   Lymphs Abs 1.6 0.7 - 4.0 K/uL   Monocytes  Absolute 0.4 0.1 - 1.0 K/uL   Eosinophils Absolute 0.0 0.0 - 0.7 K/uL   Basophils Absolute 0.0 0.0 - 0.1 K/uL   RBC Morphology ELLIPTOCYTES    WBC Morphology ATYPICAL LYMPHOCYTES   Comprehensive metabolic panel     Status: Abnormal   Collection Time: 06/18/17  9:35 PM  Result Value Ref Range   Sodium 133 (L) 135 - 145 mmol/L   Potassium 4.3 3.5 - 5.1 mmol/L   Chloride 102 101 - 111 mmol/L   CO2 20 (L) 22 - 32 mmol/L   Glucose, Bld 72 65 - 99 mg/dL   BUN 5 (L) 6 - 20 mg/dL   Creatinine, Ser 6.960.50 0.44 - 1.00 mg/dL   Calcium 9.4 8.9 - 29.510.3 mg/dL   Total Protein 7.5 6.5 - 8.1 g/dL   Albumin 3.7 3.5 - 5.0 g/dL   AST 51 (H) 15 - 41 U/L   ALT 70 (H) 14 - 54 U/L   Alkaline Phosphatase 104 38 - 126 U/L   Total Bilirubin 1.5 (H) 0.3 - 1.2 mg/dL   GFR calc non Af Amer >60 >60 mL/min   GFR calc Af Amer >60 >60 mL/min   Anion gap 11 5 - 15    Ref. Range 06/06/2017 19:36  AST Latest Ref Range: 15 - 41 U/L 108 (H)  ALT Latest Ref Range: 14 - 54 U/L 165 (H)    Ref. Range 06/12/2017 20:30  AST Latest Ref Range: 15 - 41 U/L 67 (H)  ALT Latest Ref Range: 14 - 54 U/L 104 (H)    Ref. Range 06/06/2017 19:36  Total Bilirubin Latest Ref Range: 0.3 - 1.2 mg/dL 2.4 (H)    Ref. Range 06/12/2017 20:30  Total Bilirubin Latest Ref Range: 0.3 - 1.2 mg/dL 1.3 (H)    --/--/O POS, O POS (08/25 1540)  IMAGING   MAU Management/MDM: Will hydrate with LR and D5LR.  Will give Phenergan and Pepcid Third bag with Multivitamin given.  Patient did have to urinate and stated she feels better  ASSESSMENT Di-Di Twin gestation at 692w5d Hyperemesis with 4 lb weight loss in 12 days Elevated transaminases, improving Mallory-Weiss tear vs  esophagitis  PLAN Discharge home Rx medrol taper regimen Continue alternating Phenergan PV and Zofran ODT Try Cream of Wheat, oatmeal, grits.  Noodles only if Chicken Noodle soup, avoid fat [facility]  Pt stable at time of discharge. Encouraged to return here or to other Urgent Care/ED if she develops worsening of symptoms, increase in pain, fever, or other concerning symptoms.    Wynelle BourgeoisMarie Nakhi Choi CNM, MSN Certified Nurse-Midwife 06/18/2017  11:40 PM

## 2017-06-19 MED ORDER — METHYLPREDNISOLONE 4 MG PO TABS: 4.0000 mg | ORAL_TABLET | ORAL | 0 refills | Status: AC

## 2017-06-19 NOTE — Discharge Instructions (Signed)
Eating Plan for Hyperemesis Gravidarum Hyperemesis gravidarum is a severe form of morning sickness. Because this condition causes severe nausea and vomiting, it can lead to dehydration, malnutrition, and weight loss. One way to lessen the symptoms of nausea and vomiting is to follow the eating plan for hyperemesis gravidarum. It is often used along with prescribed medicines to control your symptoms. What can I do to relieve my symptoms? Listen to your body. Everyone is different and has different preferences. Find what works best for you. Take any of the following actions that are helpful to you:  Eat and drink slowly.  Eat 5-6 small meals daily instead of 3 large meals.  Eat crackers before you get out of bed in the morning.  Try having a snack in the middle of the night.  Starchy foods are usually tolerated well. Examples include cereal, toast, bread, potatoes, pasta, rice, and pretzels.  Ginger may help with nausea. Add  tsp ground ginger to hot tea or choose ginger tea.  Try drinking 100% fruit juice or an electrolyte drink. An electrolyte drink contains sodium, potassium, and chloride.  Continue to take your prenatal vitamins as told by your health care provider. If you are having trouble taking your prenatal vitamins, talk with your health care provider about different options.  Include at least 1 serving of protein with your meals and snacks. Protein options include meats or poultry, beans, nuts, eggs, and yogurt. Try eating a protein-rich snack before bed. Examples of these snacks include cheese and crackers or half of a peanut butter or [place] sandwich.  Consider eliminating foods that trigger your symptoms. These may include spicy foods, coffee, high-fat foods, very sweet foods, and acidic foods.  Try meals that have more protein combined with bland, salty, lower-fat, and dry foods, such as nuts, seeds, pretzels, crackers, and cereal.  Talk with your healthcare provider about  starting a supplement of vitamin B6.  Have fluids that are cold, clear, and carbonated or sour. Examples include lemonade, ginger ale, lemon-lime soda, ice water, and sparkling water.  Try lemon or mint tea.  Try brushing your teeth or using a mouth rinse after meals.  What should I avoid to reduce my symptoms? Avoiding some of the following things may help reduce your symptoms.  Foods with strong smells. Try eating meals in well-ventilated areas that are free of odors.  Drinking water or other beverages with meals. Try not to drink anything during the 30 minutes before and after your meals.  Drinking more than 1 cup of fluid at a time. Sometimes using a straw helps.  Fried or high-fat foods, such as butter and cream sauces.  Spicy foods.  Skipping meals as best as you can. Nausea can be more intense on an empty stomach. If you cannot tolerate food at that time, do not force it. Try sucking on ice chips or other frozen items, and make up for missed calories later.  Lying down within 2 hours after eating.  Environmental triggers. These may include smoky rooms, closed spaces, rooms with strong smells, warm or humid places, overly loud and noisy rooms, and rooms with motion or flickering lights.  Quick and sudden changes in your movement.  This information is not intended to replace advice given to you by your health care provider. Make sure you discuss any questions you have with your health care provider. Document Released: 02/03/2007 Document Revised: 12/06/2015 Document Reviewed: 11/07/2015 Elsevier Interactive Patient Education  2018 [employer] Hematemesis Hematemesis is when  you vomit blood. It is a sign of bleeding in the upper part of your digestive tract. This is also called your gastrointestinal (GI) tract. Your upper GI tract includes your mouth, throat, esophagus, stomach, and the first part of your small intestine (duodenum). Hematemesis is usually caused by bleeding  from your esophagus or stomach. You may suddenly vomit bright red blood. You might also vomit old blood. It may look like coffee grounds. You may also have other symptoms, such as:  Stomach pain.  Heartburn.  Black and tarry stool.  Follow these instructions at home: Watch your hematemesis for any changes. The following actions may help to lessen any discomfort you are feeling:  Take medicines only as directed by your health care provider. Do not take aspirin, ibuprofen, or any other anti-inflammatory medicine without approval from your health care provider.  Rest as needed.  Drink small sips of clear liquids often, as long as you can keep them down. Try to drink enough fluids to keep your urine clear or pale yellow.  Do not drink alcohol.  Do not use any tobacco products, including cigarettes, chewing tobacco, or electronic cigarettes. If you need help quitting, ask your health care provider.  Keep all follow-up visits as directed by your health care provider. This is important.  Contact a health care provider if:  The vomiting of blood worsens, or begins again after it has stopped.  You have persistent stomach pain.  You have nausea, indigestion, or heartburn.  You feel weak or dizzy. Get help right away if:  You faint or feel extremely weak.  You have a rapid heartbeat.  You are urinating less than normal or not at all.  You have persistent vomiting.  You vomit large amounts of bloody or dark material.  You vomit bright red blood.  You pass large, dark, or bloody stools.  You have chest pain or trouble breathing. This information is not intended to replace advice given to you by your health care provider. Make sure you discuss any questions you have with your health care provider. Document Released: 05/16/2004 Document Revised: 09/14/2015 Document Reviewed: 12/01/2013 Elsevier Interactive Patient Education  2018 [employer].  Methylprednisolone tablets What  is this medicine? METHYLPREDNISOLONE (meth ill pred NISS oh lone) is a corticosteroid. It is commonly used to treat inflammation of the skin, joints, lungs, and other organs. Common conditions treated include asthma, allergies, and arthritis. It is also used for other conditions, such as blood disorders and diseases of the adrenal glands. This medicine may be used for other purposes; ask your health care provider or pharmacist if you have questions. COMMON BRAND NAME(S): Medrol, Medrol Dosepak What should I tell my health care provider before I take this medicine? They need to know if you have any of these conditions: -Cushing's syndrome  -eye disease, vision problems -diabetes -glaucoma -heart disease -high blood pressure -infection (especially a virus infection such as chickenpox, cold sores, or herpes) -liver disease -mental illness -myasthenia gravis -osteoporosis -recently received or scheduled to receive a vaccine -seizures -stomach or intestine problems -thyroid disease -an unusual or allergic reaction to lactose, methylprednisolone, other medicines, foods, dyes, or preservatives -pregnant or trying to get pregnant -breast-feeding How should I use this medicine? Take this medicine by mouth with a glass of water. Follow the directions on the prescription label. Take this medicine with food. If you are taking this medicine once a day, take it in the morning. Do not take it more often than directed. Do  not suddenly stop taking your medicine because you may develop a severe reaction. Your doctor will tell you how much medicine to take. If your doctor wants you to stop the medicine, the dose may be slowly lowered over time to avoid any side effects. Talk to your pediatrician regarding the use of this medicine in children. Special care may be needed. Overdosage: If you think you have taken too much of this medicine contact a poison control center or emergency room at once. NOTE: This  medicine is only for you. Do not share this medicine with others. What if I miss a dose? If you miss a dose, take it as soon as you can. If it is almost time for your next dose, talk to your doctor or health care professional. You may need to miss a dose or take an extra dose. Do not take double or extra doses without advice. What may interact with this medicine? Do not take this medicine with any of the following medications: -alefacept -echinacea -live virus vaccines -metyrapone -mifepristone This medicine may also interact with the following medications: -amphotericin B -aspirin and aspirin-like medicines -certain antibiotics like erythromycin, clarithromycin, troleandomycin -certain medicines for diabetes -certain medicines for fungal infections like ketoconazole -certain medicines for seizures like carbamazepine, phenobarbital, phenytoin -certain medicines that treat or prevent blood clots like warfarin -cholestyramine -cyclosporine -digoxin -diuretics -female hormones, like estrogens and birth control pills -isoniazid -NSAIDs, medicines for pain inflammation, like ibuprofen or naproxen -other medicines for myasthenia gravis -rifampin -vaccines This list may not describe all possible interactions. Give your health care provider a list of all the medicines, herbs, non-prescription drugs, or dietary supplements you use. Also tell them if you smoke, drink alcohol, or use illegal drugs. Some items may interact with your medicine. What should I watch for while using this medicine? Tell your doctor or healthcare professional if your symptoms do not start to get better or if they get worse. Do not stop taking except on your doctor's advice. You may develop a severe reaction. Your doctor will tell you how much medicine to take. This medicine may increase your risk of getting an infection. Tell your doctor or health care professional if you are around anyone with measles or chickenpox, or  if you develop sores or blisters that do not heal properly. This medicine may affect blood sugar levels. If you have diabetes, check with your doctor or health care professional before you change your diet or the dose of your diabetic medicine. Tell your doctor or health care professional right away if you have any change in your eyesight. Using this medicine for a long time may increase your risk of low bone mass. Talk to your doctor about bone health. What side effects may I notice from receiving this medicine? Side effects that you should report to your doctor or health care professional as soon as possible: -allergic reactions like skin rash, itching or hives, swelling of the face, lips, or tongue -bloody or tarry stools -changes in vision -hallucination, loss of contact with reality -muscle cramps -muscle pain -palpitations -signs and symptoms of high blood sugar such as dizziness; dry mouth; dry skin; fruity breath; nausea; stomach pain; increased hunger or thirst; increased urination -signs and symptoms of infection like fever or chills; cough; sore throat; pain or trouble passing urine -trouble passing urine or change in the amount of urine Side effects that usually do not require medical attention (report to your doctor or health care professional if they continue or  are bothersome): -changes in emotions or mood -constipation -diarrhea -excessive hair growth on the face or body -headache -nausea, vomiting -trouble sleeping -weight gain This list may not describe all possible side effects. Call your doctor for medical advice about side effects. You may report side effects to FDA at 1-800-FDA-1088. Where should I keep my medicine? Keep out of the reach of children. Store at room temperature between 20 and 25 degrees C (68 and 77 degrees F). Throw away any unused medicine after the expiration date. NOTE: This sheet is a summary. It may not cover all possible information. If you  have questions about this medicine, talk to your doctor, pharmacist, or health care provider.  2018 Elsevier/Gold Standard (2015-06-15 15:53:30)

## 2017-06-21 LAB — CULTURE, OB URINE: Culture: NO GROWTH

## 2017-10-01 ENCOUNTER — Ambulatory Visit (HOSPITAL_COMMUNITY)
Admission: EM | Admit: 2017-10-01 | Discharge: 2017-10-01 | Disposition: A | Discharge status: Home / Self Care | Payer: Medicaid Other | Attending: Family Medicine | Admitting: Family Medicine

## 2017-10-01 ENCOUNTER — Encounter (HOSPITAL_COMMUNITY): Payer: Self-pay | Admitting: Emergency Medicine

## 2017-10-01 ENCOUNTER — Other Ambulatory Visit: Payer: Self-pay

## 2017-10-01 DIAGNOSIS — H60502 Unspecified acute noninfective otitis externa, left ear: Secondary | ICD-10-CM

## 2017-10-01 MED ORDER — NEOMYCIN-POLYMYXIN-HC 3.5-10000-1 OT SUSP: 3.0000 [drp] | Freq: Three times a day (TID) | OTIC | 0 refills | Status: DC

## 2017-10-01 NOTE — ED Provider Notes (Signed)
[facility] CARE CENTER   [id number] 10/01/17 Arrival Time: 1111  ASSESSMENT & PLAN:  1. Acute otitis externa of left ear, unspecified type    Meds ordered this encounter  Medications  . neomycin-polymyxin-hydrocortisone (CORTISPORIN) 3.5-10000-1 OTIC suspension    Sig: Place 3 drops into the left ear 3 (three) times daily.    Dispense:  10 mL    Refill:  0   Discussed typical duration of symptoms. May f/u with PCP or here as needed.  Reviewed expectations re: course of current medical issues. Questions answered. Outlined signs and symptoms indicating need for more acute intervention. Patient verbalized understanding. After Visit Summary given.   SUBJECTIVE: History from: patient.  Miranda James is a 22 y.o. female who presents with complaint of left otalgia without drainage. Onset gradual, approximately a few days ago. Recent cold symptoms: none. Fever: no. Overall normal PO intake without n/v. Sick contacts: no. OTC treatment: none. Reports h/o cerumen impaction bilaterally. No hearing changes. Occasional Q-tip use in ears.  Social History   Tobacco Use  Smoking Status Never Smoker  Smokeless Tobacco Never Used    ROS: As per HPI.   OBJECTIVE:  Vitals:   10/01/17 1126  BP: (!) 120/57  Pulse: 72  Resp: 18  Temp: (!) 97.2 F (36.2 C)  TempSrc: Oral  SpO2: 100%     General appearance: alert; appears fatigued Ear Canal: edema and inflammation on the left TM: bilateral: normal Neck: supple without LAD Lungs: unlabored respirations Skin: warm and dry Psychological: alert and cooperative; normal mood and affect  No Known Allergies  Past Medical History:  Diagnosis Date  . Bacterial vaginitis 09/25/2016  . Medical history non-contributory    Family History  Problem Relation Age of Onset  . Diabetes Mother    Social History   Socioeconomic History  . Marital status: Single    Spouse name: Not on file  . Number of children: Not on file  . Years  of education: Not on file  . Highest education level: Not on file  Occupational History  . Not on file  Social Needs  . Financial resource strain: Not on file  . Food insecurity:    Worry: Not on file    Inability: Not on file  . Transportation needs:    Medical: Not on file    Non-medical: Not on file  Tobacco Use  . Smoking status: Never Smoker  . Smokeless tobacco: Never Used  Substance and Sexual Activity  . Alcohol use: No  . Drug use: No  . Sexual activity: Yes    Birth control/protection: Patch  Lifestyle  . Physical activity:    Days per week: Not on file    Minutes per session: Not on file  . Stress: Not on file  Relationships  . Social connections:    Talks on phone: Not on file    Gets together: Not on file    Attends religious service: Not on file    Active member of club or organization: Not on file    Attends meetings of clubs or organizations: Not on file    Relationship status: Not on file  . Intimate partner violence:    Fear of current or ex partner: Not on file    Emotionally abused: Not on file    Physically abused: Not on file    Forced sexual activity: Not on file  Other Topics Concern  . Not on file  Social History Narrative  . Not on file  Mardella LaymanHagler, Cosby Proby, MD 10/01/17 1216

## 2017-10-01 NOTE — ED Triage Notes (Signed)
Left ear pain that started 3 days ago.  Denies cold issues.

## 2017-11-03 ENCOUNTER — Other Ambulatory Visit: Payer: Self-pay

## 2017-11-03 ENCOUNTER — Emergency Department (HOSPITAL_COMMUNITY)
Admission: EM | Admit: 2017-11-03 | Discharge: 2017-11-03 | Discharge status: Against Medical Advice | Payer: Medicaid Other | Attending: Emergency Medicine | Admitting: Emergency Medicine

## 2017-11-03 ENCOUNTER — Encounter (HOSPITAL_COMMUNITY): Payer: Self-pay | Admitting: Emergency Medicine

## 2017-11-03 DIAGNOSIS — R200 Anesthesia of skin: Secondary | ICD-10-CM | POA: Insufficient documentation

## 2017-11-03 LAB — BASIC METABOLIC PANEL
Anion gap: 7 (ref 5–15)
BUN: 8 mg/dL (ref 6–20)
CALCIUM: 9 mg/dL (ref 8.9–10.3)
CHLORIDE: 108 mmol/L (ref 98–111)
CO2: 26 mmol/L (ref 22–32)
Creatinine, Ser: 0.84 mg/dL (ref 0.44–1.00)
GFR calc non Af Amer: >60 mL/min (ref >60)
GLUCOSE: 83 mg/dL (ref 70–99)
Potassium: 4.4 mmol/L (ref 3.5–5.1)
Sodium: 141 mmol/L (ref 135–145)

## 2017-11-03 LAB — CBC
HCT: 36 % (ref 36.0–46.0)
Hemoglobin: 10.9 g/dL — ABNORMAL LOW (ref 12.0–15.0)
MCH: 22.6 pg — AB (ref 26.0–34.0)
MCHC: 30.3 g/dL (ref 30.0–36.0)
MCV: 74.5 fL — AB (ref 78.0–100.0)
PLATELETS: 320 10*3/uL (ref 150–400)
RBC: 4.83 MIL/uL (ref 3.87–5.11)
RDW: 16.6 % — ABNORMAL HIGH (ref 11.5–15.5)
WBC: 9 10*3/uL (ref 4.0–10.5)

## 2017-11-03 NOTE — ED Triage Notes (Signed)
Pt states since Saturday she has had left sided numbness to her left breast and left arm that comes and goes, denies any pain. No neurological deficits.

## 2017-11-03 NOTE — ED Provider Notes (Signed)
Patient placed in Quick Look pathway, seen and evaluated   Chief Complaint: numbness  HPI:   22 year old female presenting with intermittent left arm numbness that starts at the left hand and radiates up the arm and down the left side of her chest. She reports associated left-sided chest "discomfort" with her symptoms. Today, she had an episode while walking her son that was worse and last longer than previous episodes, which were about 5 minutes. No dyspnea, weakness, HA, rash, neck, or shoulder pain. No recent falls or injuries.   ROS: numbness (one)  Physical Exam:   Gen: No distress  Neuro: Awake and Alert  Skin: Warm    Focused Exam: Full active and passive ROM of the bilateral upper extremities. No TTP to the cervical spinous processes. Good strength against resistance. +Murmur. Heart is RRR. Lungs are CTAB.   Will order basic labs to check for electrolyte abnormalities and anemia.   Initiation of care has begun. The patient has been counseled on the process, plan, and necessity for staying for the completion/evaluation, and the remainder of the medical screening examination    Barkley BoardsMcDonald, Eilyn Polack A, PA-C 11/03/17 [id number] [id number]    Plunkett, Whitney, MD 11/08/17 [phone]

## 2017-11-03 NOTE — ED Notes (Signed)
Vitals done on wrong pt. Previous vitals are incorrect.

## 2017-11-03 NOTE — ED Notes (Signed)
Pt states that she is leaving due to wait times, unable to convince pt to stay  

## 2017-11-04 ENCOUNTER — Encounter (HOSPITAL_COMMUNITY): Payer: Self-pay | Admitting: Internal Medicine

## 2017-11-04 ENCOUNTER — Other Ambulatory Visit: Payer: Self-pay

## 2017-11-04 ENCOUNTER — Emergency Department (HOSPITAL_COMMUNITY)
Admission: EM | Admit: 2017-11-04 | Discharge: 2017-11-04 | Disposition: A | Discharge status: Home / Self Care | Payer: Medicaid Other | Attending: Emergency Medicine | Admitting: Emergency Medicine

## 2017-11-04 ENCOUNTER — Emergency Department (HOSPITAL_COMMUNITY)
Admission: EM | Admit: 2017-11-04 | Discharge: 2017-11-04 | Discharge status: Against Medical Advice | Payer: Medicaid Other | Source: Home / Self Care

## 2017-11-04 DIAGNOSIS — R202 Paresthesia of skin: Secondary | ICD-10-CM

## 2017-11-04 DIAGNOSIS — Z79899 Other long term (current) drug therapy: Secondary | ICD-10-CM | POA: Insufficient documentation

## 2017-11-04 LAB — URINALYSIS, ROUTINE W REFLEX MICROSCOPIC
BILIRUBIN URINE: NEGATIVE
Glucose, UA: NEGATIVE mg/dL
KETONES UR: NEGATIVE mg/dL
LEUKOCYTES UA: NEGATIVE
Nitrite: NEGATIVE
Protein, ur: NEGATIVE mg/dL
Specific Gravity, Urine: 1.023 (ref 1.005–1.030)
pH: 5 (ref 5.0–8.0)

## 2017-11-04 NOTE — ED Notes (Signed)
Called form triage multiple time with no answer

## 2017-11-04 NOTE — ED Triage Notes (Signed)
Pt here from home c/o tingling on inner aspect of left arm and left flank intermittently since Saturday. Pt reports "rubbing my arm on my body helps." No allergies to medications, vital signs stable.

## 2017-11-04 NOTE — ED Provider Notes (Signed)
[facility] EMERGENCY DEPARTMENT Provider Note   CSN: [id number] Arrival date & time: 11/04/17  1158     History   Chief Complaint Chief Complaint  Patient presents with  . Tingling    HPI Miranda James is a 22 y.o. female.  22 y/o female with no PMH presents to the ED with a chief complaint of left arm tingling that began 3 days ago. Patient describes the pain as intermittent on her left hand radiating to her left shoulder and left breast. Patient has not tried anything for relieve, and denies exacerbations in symptoms with movement. Patient was seen here yesterday for the same complaint but left without being seen due to wait times. She denies any chest pain, abdominal pain, shortness of breath,urinary symptoms or trauma.       Past Medical History:  Diagnosis Date  . Bacterial vaginitis 09/25/2016  . Medical history non-contributory     Patient Active Problem List   Diagnosis Date Noted  . Elevated liver enzymes 06/12/2017  . Twin gestation in first trimester 06/06/2017  . IUGR (intrauterine growth restriction) affecting care of mother 12/14/2016  . Bacterial vaginitis 09/25/2016    Past Surgical History:  Procedure Laterality Date  . CESAREAN SECTION N/A 12/15/2016   Procedure: CESAREAN SECTION;  Surgeon: Essie Hart, MD;  Location: [facility] BIRTHING SUITES;  Service: Obstetrics;  Laterality: N/A;  . NO PAST SURGERIES       OB History    Gravida  3   Para  1   Term  1   Preterm      AB  1   Living  1     SAB  1   TAB      Ectopic      Multiple  0   Live Births  1            Home Medications    Prior to Admission medications   Medication Sig Start Date End Date Taking? Authorizing Provider  famotidine (PEPCID) 20 MG tablet Take 1 tablet (20 mg total) by mouth 2 (two) times daily. 06/06/17   Aviva Signs, CNM  neomycin-polymyxin-hydrocortisone (CORTISPORIN) 3.5-10000-1 OTIC suspension Place 3 drops into the left ear 3  (three) times daily. 10/01/17   Mardella Layman, MD  ondansetron (ZOFRAN ODT) 4 MG disintegrating tablet Take 1 tablet (4 mg total) by mouth every 6 (six) hours as needed for nausea. 06/12/17   Aviva Signs, CNM  Prenatal Vit-Fe Fumarate-FA (PRENATAL MULTIVITAMIN) TABS tablet Take 1 tablet by mouth daily at 12 noon.    [provider]  promethazine (PHENERGAN) 25 MG tablet Take 1 tablet (25 mg total) by mouth every 6 (six) hours as needed for nausea or vomiting. 05/16/17   Rolm Bookbinder, CNM    Family History Family History  Problem Relation Age of Onset  . Diabetes Mother     Social History Social History   Tobacco Use  . Smoking status: Never Smoker  . Smokeless tobacco: Never Used  Substance Use Topics  . Alcohol use: No  . Drug use: No     Allergies   Patient has no known allergies.   Review of Systems Review of Systems  Constitutional: Negative for chills and fever.  HENT: Negative for rhinorrhea, sinus pain and sore throat.   Eyes: Negative for pain.  Respiratory: Negative for shortness of breath and wheezing.   Cardiovascular: Negative for chest pain and palpitations.  Gastrointestinal: Negative for abdominal pain, diarrhea,  nausea and vomiting.  Genitourinary: Negative for dysuria and flank pain.  Musculoskeletal: Negative for back pain.  Neurological: Negative for syncope, light-headedness and headaches.  All other systems reviewed and are negative.    Physical Exam Updated Vital Signs BP 113/69 (BP Location: Right Arm)   Pulse 73   Temp 98.2 F (36.8 C) (Oral)   Resp 16   LMP 11/03/2017   SpO2 99% Comment: RA  Physical Exam  Constitutional: She is oriented to person, place, and time. She appears well-developed and well-nourished.  HENT:  Head: Normocephalic and atraumatic.  Eyes: Pupils are equal, round, and reactive to light.  Neck: Normal range of motion.  Cardiovascular: Normal heart sounds.  Pulmonary/Chest: Effort normal and  breath sounds normal. No respiratory distress. She has no wheezes.  Abdominal: Soft. Bowel sounds are normal. There is no tenderness.  Musculoskeletal: She exhibits no edema, tenderness or deformity.  Neurological: She is alert and oriented to person, place, and time. She has normal strength. No cranial nerve deficit or sensory deficit. GCS eye subscore is 4. GCS verbal subscore is 5. GCS motor subscore is 6.  Cranial nerve II-XII intact. Patient shows no facial asymmetry. Strength is normal in BUE and BLE  Skin: Skin is warm and dry.     ED Treatments / Results  Labs (all labs ordered are listed, but only abnormal results are displayed) Labs Reviewed  URINALYSIS, ROUTINE W REFLEX MICROSCOPIC    EKG None  Radiology No results found.  Procedures Procedures (including critical care time)  Medications Ordered in ED Medications - No data to display   Initial Impression / Assessment and Plan / ED Course  I have reviewed the triage vital signs and the nursing notes.  Pertinent labs & imaging results that were available during my care of the patient were reviewed by me and considered in my medical decision making (see chart for details).     All results are within normal limits. Laboratory results showed no electrolyte abnormality. Upon examination she exhibits no tenderness on chest wall or neck region.Her symptoms do not follow a dermatome pattern.Patient is lying comfortable playing on her phone. She has no shortness of breath of chest pain. Advised patient that this might be due to position while sleeping. I have provided patient the number to neurology to follow up.I have told her to make an appointment with her PCP in 1 week.Return precautions provided.   Final Clinical Impressions(s) / ED Diagnoses   Final diagnoses:  Paresthesia    ED Discharge Orders    None       Claude MangesSoto, Tylar Amborn, PA-C 11/04/17 1421    Derwood KaplanNanavati, Ankit, MD 11/06/17 [phone]

## 2017-11-04 NOTE — Discharge Instructions (Signed)
All laboratory results are normal.Please follow up with PCP in 1 week for evaluation of symptoms. I have provided a referral for neurology please schedule appointment at your earliest convenience.Please return to the ED if you experience any chest pain, shortness of breath of weakness.

## 2018-03-12 ENCOUNTER — Other Ambulatory Visit: Payer: Self-pay

## 2018-03-12 ENCOUNTER — Encounter (HOSPITAL_COMMUNITY): Payer: Self-pay | Admitting: Emergency Medicine

## 2018-03-12 ENCOUNTER — Ambulatory Visit (HOSPITAL_COMMUNITY)
Admission: EM | Admit: 2018-03-12 | Discharge: 2018-03-12 | Disposition: A | Discharge status: Home / Self Care | Payer: Medicaid Other | Attending: Family Medicine | Admitting: Family Medicine

## 2018-03-12 DIAGNOSIS — H60503 Unspecified acute noninfective otitis externa, bilateral: Secondary | ICD-10-CM | POA: Diagnosis not present

## 2018-03-12 DIAGNOSIS — H6123 Impacted cerumen, bilateral: Secondary | ICD-10-CM

## 2018-03-12 MED ORDER — NEOMYCIN-POLYMYXIN-HC 3.5-10000-1 OT SUSP: 4.0000 [drp] | Freq: Three times a day (TID) | OTIC | 0 refills | Status: DC

## 2018-03-12 MED ORDER — IBUPROFEN 800 MG PO TABS: 800.0000 mg | ORAL_TABLET | Freq: Three times a day (TID) | ORAL | 0 refills | Status: DC

## 2018-03-12 NOTE — ED Triage Notes (Signed)
Patient reports a headache on Monday.  Headache everyday since onset.  Now feeling pressure on both ears.  Reports issues opening and closing mouth.

## 2018-03-12 NOTE — Discharge Instructions (Signed)
Use eardrops as directed for 5 days Take ibuprofen 3 times a day with food Drink plenty of fluids Expect improvement in the next few days.  Return if fails to improve

## 2018-03-12 NOTE — ED Provider Notes (Signed)
[facility]    CSN: [id number] Arrival date & time: 03/12/18  1701     History   Chief Complaint Chief Complaint  Patient presents with  . Otalgia    HPI Miranda James is a 22 y.o. female.   HPI  Patient has recurring problems with cerumen impaction.  Right now she has pressure and pain in both ears.  She feels diminished hearing especially on the right.  She states that her jaw joints hurt when she bites down.  She is also been having headache.  This is been going on for the last few days.  No fever or chills.  No sinus congestion, runny or stuffy nose.  No itchy or watery eyes.  No fever chills.  No sore throat.  Past Medical History:  Diagnosis Date  . Bacterial vaginitis 09/25/2016  . Medical history non-contributory     Patient Active Problem List   Diagnosis Date Noted  . Elevated liver enzymes 06/12/2017  . Twin gestation in first trimester 06/06/2017  . IUGR (intrauterine growth restriction) affecting care of mother 12/14/2016  . Bacterial vaginitis 09/25/2016    Past Surgical History:  Procedure Laterality Date  . CESAREAN SECTION N/A 12/15/2016   Procedure: CESAREAN SECTION;  Surgeon: Essie Hart, MD;  Location: [facility] BIRTHING SUITES;  Service: Obstetrics;  Laterality: N/A;  . NO PAST SURGERIES      OB History    Gravida  3   Para  1   Term  1   Preterm      AB  1   Living  1     SAB  1   TAB      Ectopic      Multiple  0   Live Births  1            Home Medications    Prior to Admission medications   Medication Sig Start Date End Date Taking? Authorizing Provider  ibuprofen (ADVIL,MOTRIN) 800 MG tablet Take 1 tablet (800 mg total) by mouth 3 (three) times daily. 03/12/18   Eustace Moore, MD  neomycin-polymyxin-hydrocortisone (CORTISPORIN) 3.5-10000-1 OTIC suspension Place 4 drops into both ears 3 (three) times daily. 03/12/18   Eustace Moore, MD    Family History Family History  Problem Relation Age of  Onset  . Diabetes Mother     Social History Social History   Tobacco Use  . Smoking status: Current Some Day Smoker  . Smokeless tobacco: Never Used  Substance Use Topics  . Alcohol use: No  . Drug use: No     Allergies   Patient has no known allergies.   Review of Systems Review of Systems  Constitutional: Negative for chills and fever.  HENT: Positive for ear pain and hearing loss. Negative for dental problem and sore throat.        Pain in TMJ area with chewing  Eyes: Negative for pain and visual disturbance.  Respiratory: Negative for cough and shortness of breath.   Cardiovascular: Negative for chest pain and palpitations.  Gastrointestinal: Negative for abdominal pain and vomiting.  Genitourinary: Negative for dysuria and hematuria.  Musculoskeletal: Negative for arthralgias and back pain.  Skin: Negative for color change and rash.  Neurological: Positive for headaches. Negative for seizures and syncope.  All other systems reviewed and are negative.    Physical Exam Triage Vital Signs ED Triage Vitals  Enc Vitals Group     BP 03/12/18 1718 130/61     Pulse Rate  03/12/18 1718 73     Resp 03/12/18 1718 16     Temp 03/12/18 1718 98.4 F (36.9 C)     Temp Source 03/12/18 1718 Oral     SpO2 03/12/18 1718 98 %     Weight --      Height --      Head Circumference --      Peak Flow --      Pain Score 03/12/18 1723 7     Pain Loc --      Pain Edu? --      Excl. in GC? --    No data found.  Updated Vital Signs BP 130/61 (BP Location: Right Arm)   Pulse 73   Temp 98.4 F (36.9 C) (Oral)   Resp 16   LMP 03/08/2018   SpO2 98%      Physical Exam  Constitutional: She appears well-developed and well-nourished. No distress.  HENT:  Head: Normocephalic and atraumatic.  Right Ear: External ear normal.  Left Ear: External ear normal.  Mouth/Throat: Oropharynx is clear and moist.  Both canals with cerumen.  Irrigated and lavaged clear.  Both TMs are  slightly dull with edema of the outer ear canals.  Mild pain with traction of pinna.  Mild tenderness over TMJ joints.  Eyes: Pupils are equal, round, and reactive to light. Conjunctivae are normal.  Neck: Normal range of motion.  Cardiovascular: Normal rate, regular rhythm and normal heart sounds.  Pulmonary/Chest: Effort normal and breath sounds normal. No respiratory distress.  Abdominal: Soft. She exhibits no distension.  Musculoskeletal: Normal range of motion. She exhibits no edema.  Neurological: She is alert. She displays normal reflexes. No cranial nerve deficit. Coordination normal.  Skin: Skin is warm and dry.  Psychiatric: She has a normal mood and affect. Her behavior is normal.     UC Treatments / Results  Labs (all labs ordered are listed, but only abnormal results are displayed) Labs Reviewed - No data to display  EKG None  Radiology No results found.  Procedures Procedures (including critical care time)  Medications Ordered in UC Medications - No data to display  Initial Impression / Assessment and Plan / UC Course  I have reviewed the triage vital signs and the nursing notes.  Pertinent labs & imaging results that were available during my care of the patient were reviewed by me and considered in my medical decision making (see chart for details).     She may have inflammation of the ear canals because of the irrigation, however, with pain on traction of pinna feel that she may have otitis externa.  I will give her eardrops to use for the next 5 days.  I am giving her ibuprofen to take for the jaw joint pain.  Return as needed Final Clinical Impressions(s) / UC Diagnoses   Final diagnoses:  Acute otitis externa of both ears, unspecified type     Discharge Instructions     Use eardrops as directed for 5 days Take ibuprofen 3 times a day with food Drink plenty of fluids Expect improvement in the next few days.  Return if fails to improve   ED  Prescriptions    Medication Sig Dispense Auth. Provider   ibuprofen (ADVIL,MOTRIN) 800 MG tablet Take 1 tablet (800 mg total) by mouth 3 (three) times daily. 21 tablet Eustace MooreNelson, Quentina Fronek Sue, MD   neomycin-polymyxin-hydrocortisone (CORTISPORIN) 3.5-10000-1 OTIC suspension Place 4 drops into both ears 3 (three) times daily. 10 mL Rica MastNelson, Atreyu Mak  Fannie Knee, MD     Controlled Substance Prescriptions [place] Controlled Substance Registry consulted? Not Applicable   Eustace Moore, MD 03/12/18 1859

## 2018-04-22 NOTE — L&D Delivery Note (Signed)
Patient was C/C/+1 and pushed for approx 35 minutes with epidural.    NSVD female infant, Apgars 9/9, weight pending.   The patient had bilateral periurethral lacerations repaired with 3-0 vicryl.. Fundus was firm. EBL was expected amount. Placenta was delivered intact. Vagina was clear.  Delayed cord clamping done for 30-60 seconds while warming baby. Baby was vigorous and doing skin to skin with mother.  Miranda James

## 2018-06-21 IMAGING — US US OB EACH ADDL GEST<[ID]
1 series · 15 of 28 positions shown · non-contrast
Comparison: None.

CLINICAL DATA: Pregnant patient.  Cramping.

EXAM:
TWIN OBSTETRICAL ULTRASOUND <14 WKS

[Series 1: us ob each addl gest<(id) · 32 acquisitions, 15 frames shown]
[im 1/32]
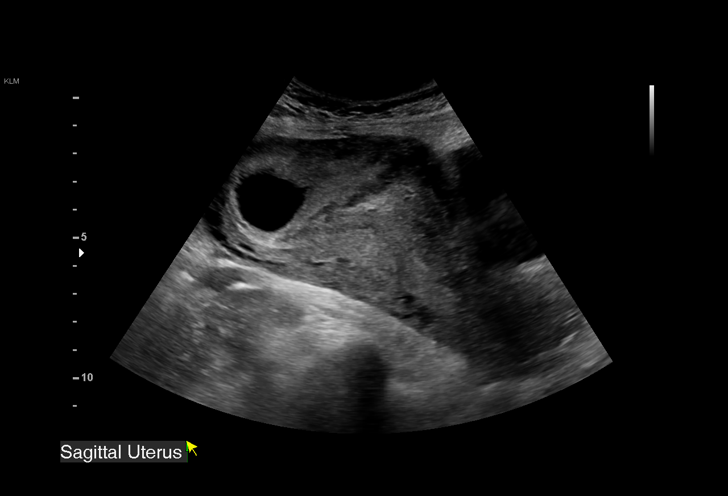
[im 3/32]
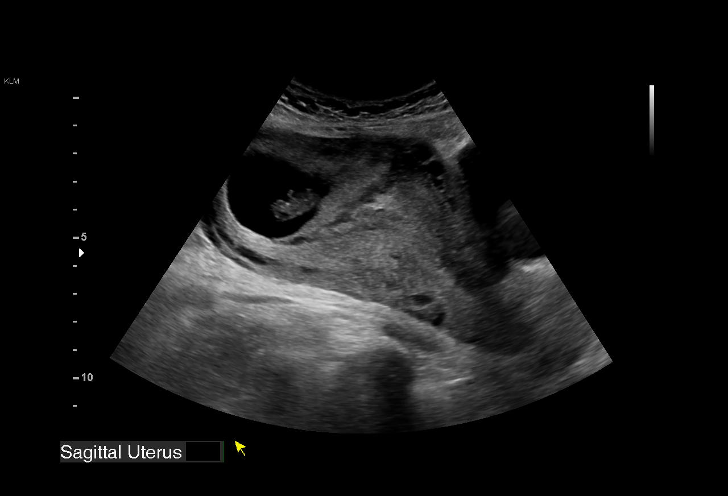
[im 5/32]
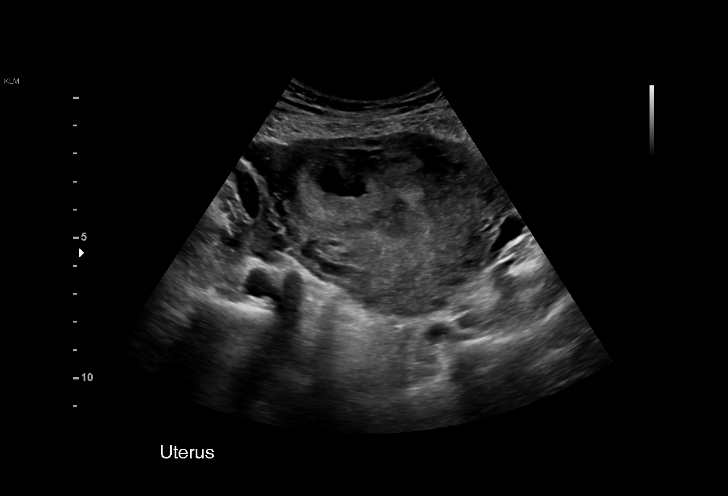
[im 7/32]
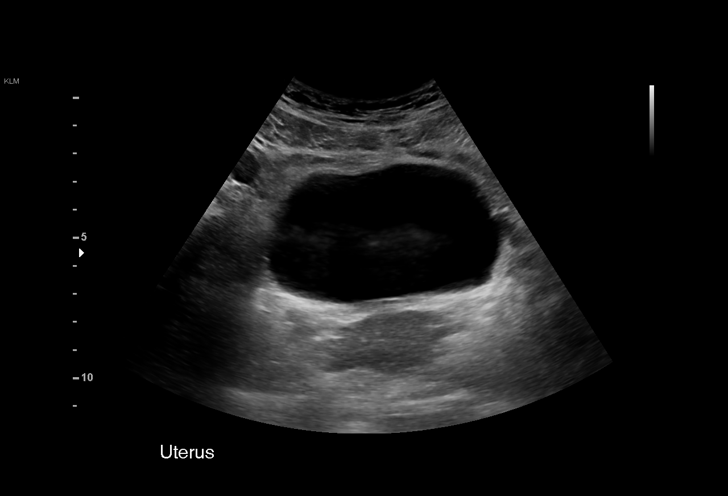
[im 10/32]
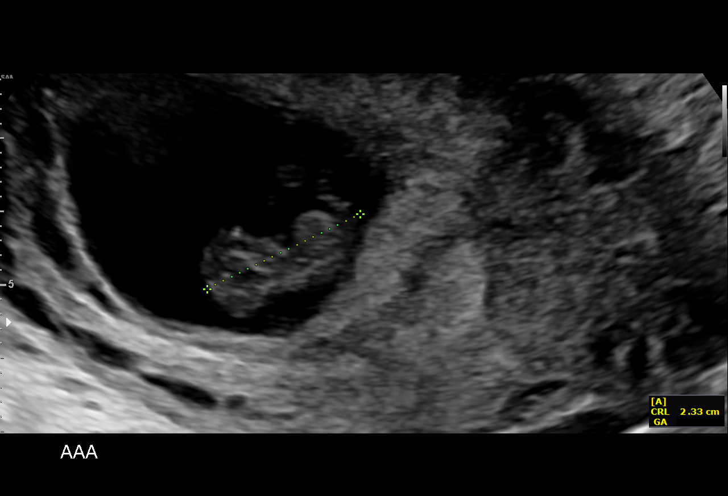
[im 12/32]
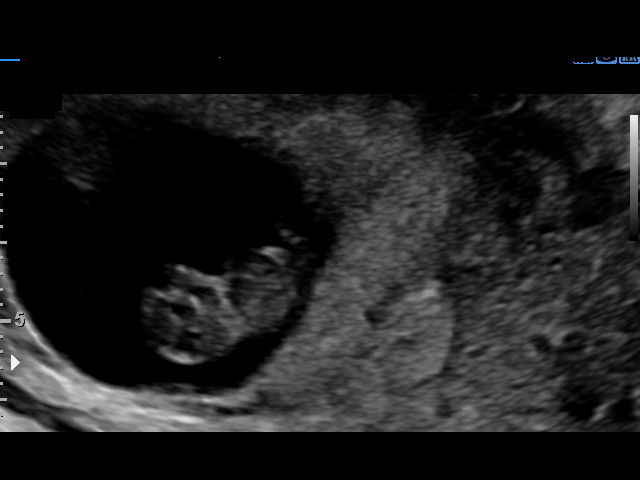
[im 14/32]
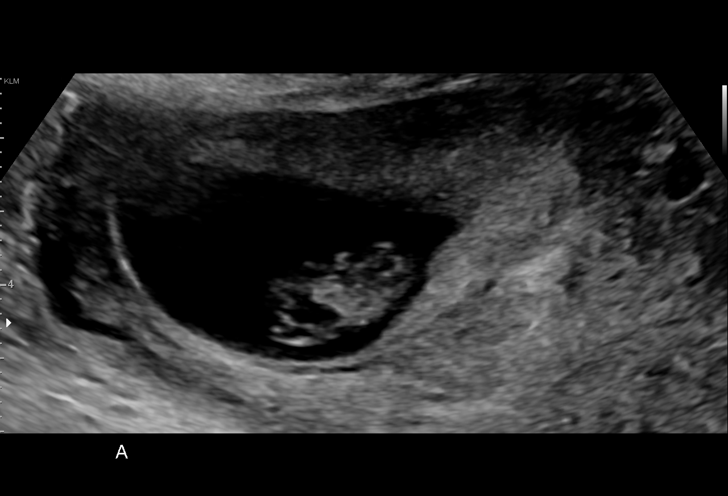
[im 17/32]
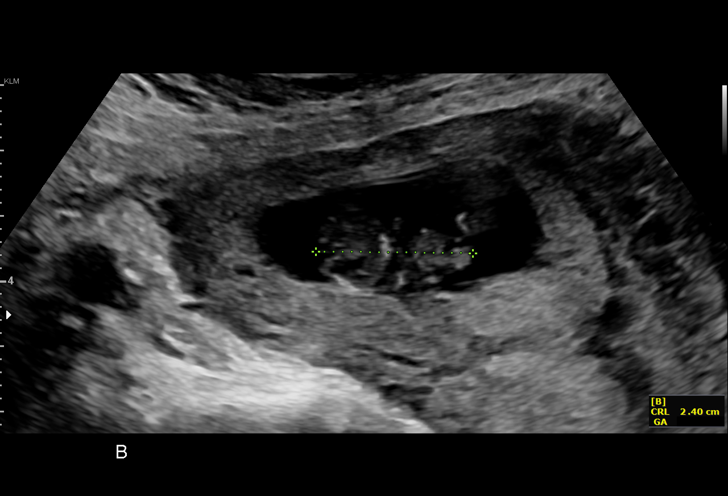
[im 18/32]
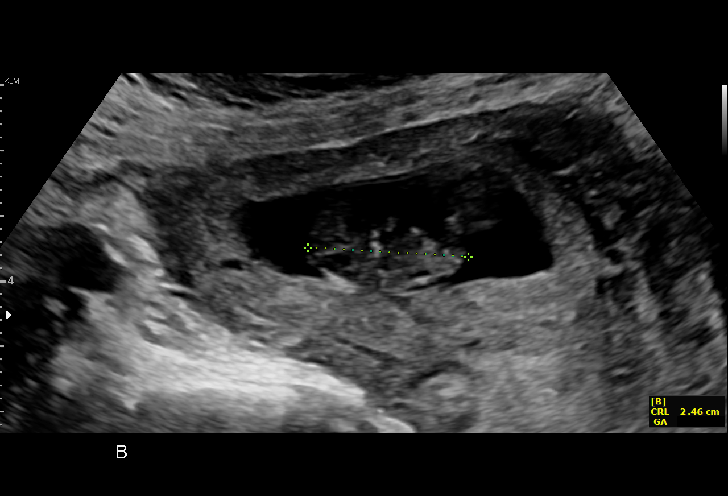
[im 20/32]
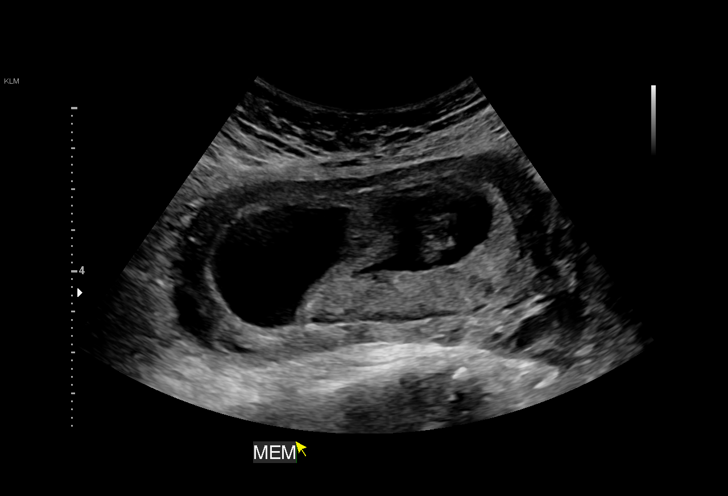
[im 22/32]
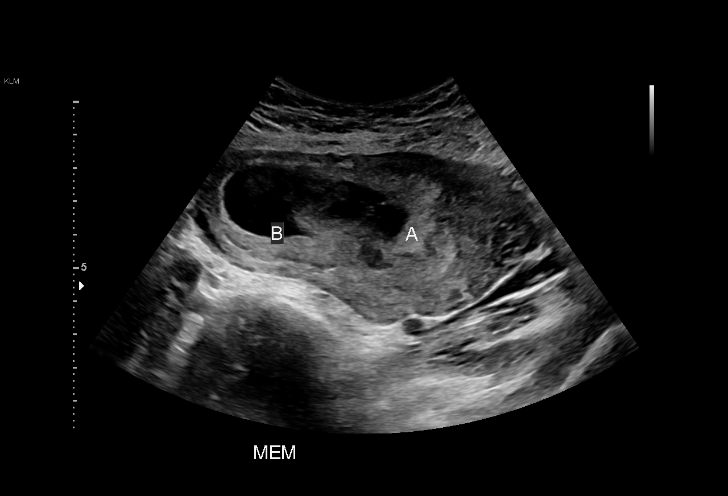
[im 25/32]
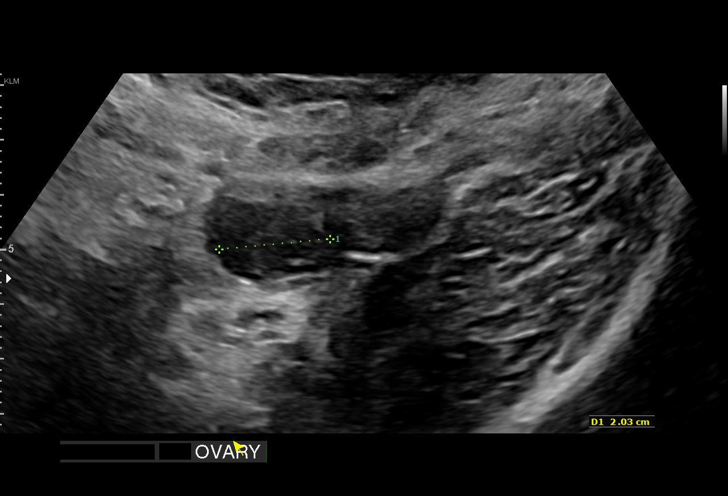
[im 27/32]
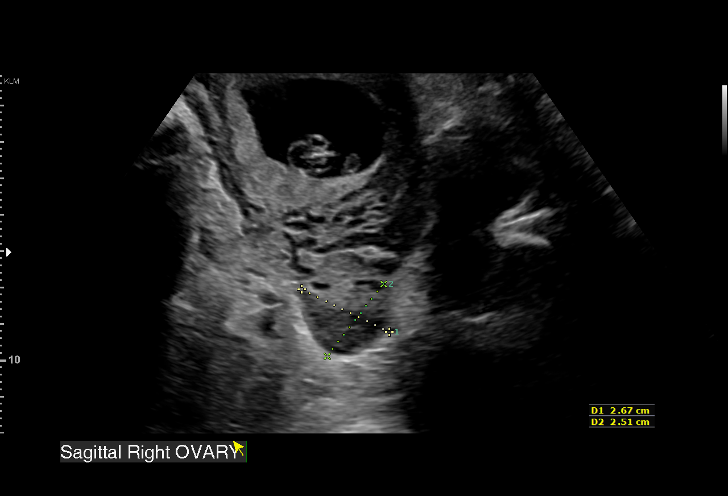
[im 29/32]
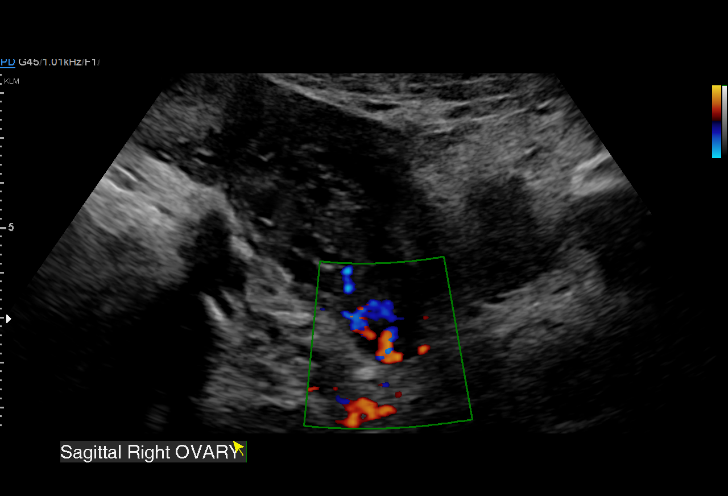
[im 32/32]
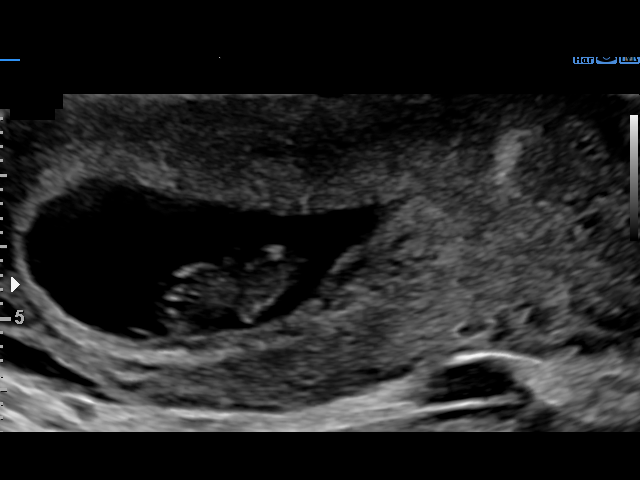

[15 of 28 positions shown; findings below may reference images not displayed]

FINDINGS: Number of IUPs:  2

Chorionicity/Amnionicity:  Dichorionic-diamniotic (thick membrane)

TWIN 1

Yolk sac:  Visualized.

Embryo:  Visualized.

Cardiac Activity: Visualized.

Heart Rate: 170 bpm

MSD:   mm    w     d

CRL:   23.4 mm   9 w 0 d                  US EDC: January 09, 2018

TWIN 2

Yolk sac:  Visualized.

Embryo:  Visualized.

Cardiac Activity: Visualized.

Heart Rate: 175 bpm

MSD:   mm    w     d

CRL:   24.2 mm   9 w 1 d                  US EDC: January 08, 2018

Subchorionic hemorrhage:  None visualized.

Maternal uterus/adnexae: The ovaries are normal in appearance.
IMPRESSION: Live dichorionic diamniotic intrauterine twin pregnancies.

## 2018-08-12 LAB — OB RESULTS CONSOLE HEPATITIS B SURFACE ANTIGEN: Hepatitis B Surface Ag: NEGATIVE

## 2018-08-12 LAB — OB RESULTS CONSOLE RUBELLA ANTIBODY, IGM: Rubella: IMMUNE

## 2018-08-12 LAB — OB RESULTS CONSOLE ANTIBODY SCREEN: Antibody Screen: NEGATIVE

## 2018-08-12 LAB — OB RESULTS CONSOLE RPR: RPR: NONREACTIVE

## 2018-08-12 LAB — OB RESULTS CONSOLE GC/CHLAMYDIA
Chlamydia: NEGATIVE
Gonorrhea: NEGATIVE

## 2018-08-12 LAB — OB RESULTS CONSOLE HIV ANTIBODY (ROUTINE TESTING): HIV: NONREACTIVE

## 2018-08-12 LAB — OB RESULTS CONSOLE ABO/RH: RH Type: POSITIVE

## 2019-02-23 ENCOUNTER — Other Ambulatory Visit: Payer: Self-pay | Admitting: Obstetrics & Gynecology

## 2019-02-24 ENCOUNTER — Other Ambulatory Visit: Payer: Self-pay | Admitting: Advanced Practice Midwife

## 2019-03-01 ENCOUNTER — Telehealth (HOSPITAL_COMMUNITY): Payer: Self-pay | Admitting: *Deleted

## 2019-03-01 ENCOUNTER — Encounter (HOSPITAL_COMMUNITY): Payer: Self-pay | Admitting: *Deleted

## 2019-03-01 NOTE — Telephone Encounter (Signed)
Preadmission screen  

## 2019-03-02 ENCOUNTER — Other Ambulatory Visit (HOSPITAL_COMMUNITY): Admission: RE | Admit: 2019-03-02 | Payer: Medicaid Other | Source: Ambulatory Visit

## 2019-03-03 ENCOUNTER — Inpatient Hospital Stay (HOSPITAL_COMMUNITY): Payer: Medicaid Other | Admitting: Anesthesiology

## 2019-03-03 ENCOUNTER — Other Ambulatory Visit: Payer: Self-pay

## 2019-03-03 ENCOUNTER — Inpatient Hospital Stay (HOSPITAL_COMMUNITY): Payer: Medicaid Other

## 2019-03-03 ENCOUNTER — Inpatient Hospital Stay (HOSPITAL_COMMUNITY)
Admission: AD | Admit: 2019-03-03 | Discharge: 2019-03-05 | DRG: 807 | Disposition: A | Discharge status: Home / Self Care | Payer: Medicaid Other | Attending: Obstetrics & Gynecology | Admitting: Obstetrics & Gynecology

## 2019-03-03 ENCOUNTER — Encounter (HOSPITAL_COMMUNITY): Payer: Self-pay | Admitting: *Deleted

## 2019-03-03 DIAGNOSIS — O99214 Obesity complicating childbirth: Secondary | ICD-10-CM | POA: Diagnosis present

## 2019-03-03 DIAGNOSIS — O34219 Maternal care for unspecified type scar from previous cesarean delivery: Principal | ICD-10-CM | POA: Diagnosis present

## 2019-03-03 DIAGNOSIS — O9902 Anemia complicating childbirth: Secondary | ICD-10-CM | POA: Diagnosis present

## 2019-03-03 DIAGNOSIS — Z3A40 40 weeks gestation of pregnancy: Secondary | ICD-10-CM | POA: Diagnosis not present

## 2019-03-03 DIAGNOSIS — E669 Obesity, unspecified: Secondary | ICD-10-CM | POA: Diagnosis present

## 2019-03-03 DIAGNOSIS — D649 Anemia, unspecified: Secondary | ICD-10-CM | POA: Diagnosis present

## 2019-03-03 DIAGNOSIS — O99824 Streptococcus B carrier state complicating childbirth: Secondary | ICD-10-CM | POA: Diagnosis present

## 2019-03-03 DIAGNOSIS — Z20828 Contact with and (suspected) exposure to other viral communicable diseases: Secondary | ICD-10-CM | POA: Diagnosis present

## 2019-03-03 DIAGNOSIS — O99334 Smoking (tobacco) complicating childbirth: Secondary | ICD-10-CM | POA: Diagnosis present

## 2019-03-03 HISTORY — DX: Anemia, unspecified: D64.9

## 2019-03-03 LAB — TYPE AND SCREEN
ABO/RH(D): O POS
Antibody Screen: NEGATIVE

## 2019-03-03 LAB — CBC
HCT: 34.7 % — ABNORMAL LOW (ref 36.0–46.0)
Hemoglobin: 11.1 g/dL — ABNORMAL LOW (ref 12.0–15.0)
MCH: 23.5 pg — ABNORMAL LOW (ref 26.0–34.0)
MCHC: 32 g/dL (ref 30.0–36.0)
MCV: 73.5 fL — ABNORMAL LOW (ref 80.0–100.0)
Platelets: 207 10*3/uL (ref 150–400)
RBC: 4.72 MIL/uL (ref 3.87–5.11)
RDW: 14.6 % (ref 11.5–15.5)
WBC: 7.9 10*3/uL (ref 4.0–10.5)
nRBC: 0 % (ref 0.0–0.2)

## 2019-03-03 LAB — SARS CORONAVIRUS 2 (TAT 6-24 HRS): SARS Coronavirus 2: NEGATIVE

## 2019-03-03 LAB — ABO/RH: ABO/RH(D): O POS

## 2019-03-03 MED ORDER — EPHEDRINE 5 MG/ML INJ: 10.0000 mg | INTRAVENOUS | Status: DC | PRN

## 2019-03-03 MED ORDER — ACETAMINOPHEN 325 MG PO TABS: 650.0000 mg | ORAL_TABLET | ORAL | Status: DC | PRN

## 2019-03-03 MED ORDER — LIDOCAINE HCL (PF) 1 % IJ SOLN
INTRAMUSCULAR | Status: DC | PRN
  Administered 2019-03-03 (×2): 4 mL via EPIDURAL

## 2019-03-03 MED ORDER — PHENYLEPHRINE 40 MCG/ML (10ML) SYRINGE FOR IV PUSH (FOR BLOOD PRESSURE SUPPORT): 80.0000 ug | PREFILLED_SYRINGE | INTRAVENOUS | Status: DC | PRN

## 2019-03-03 MED ORDER — SODIUM CHLORIDE (PF) 0.9 % IJ SOLN
INTRAMUSCULAR | Status: DC | PRN
  Administered 2019-03-03: 11 mL/h via EPIDURAL

## 2019-03-03 MED ORDER — OXYTOCIN 40 UNITS IN NORMAL SALINE INFUSION - SIMPLE MED
2.5000 [IU]/h | INTRAVENOUS | Status: DC
  Administered 2019-03-03: 2.5 [IU]/h via INTRAVENOUS
  Filled 2019-03-03: qty 1000

## 2019-03-03 MED ORDER — LIDOCAINE HCL (PF) 1 % IJ SOLN: 30.0000 mL | INTRAMUSCULAR | Status: DC | PRN

## 2019-03-03 MED ORDER — PENICILLIN G POT IN DEXTROSE 60000 UNIT/ML IV SOLN
3.0000 10*6.[IU] | INTRAVENOUS | Status: DC
  Administered 2019-03-03 (×2): 3 10*6.[IU] via INTRAVENOUS
  Filled 2019-03-03 (×2): qty 50

## 2019-03-03 MED ORDER — LACTATED RINGERS IV SOLN
INTRAVENOUS | Status: DC
  Administered 2019-03-03 (×2): via INTRAVENOUS

## 2019-03-03 MED ORDER — LACTATED RINGERS IV SOLN: 500.0000 mL | Freq: Once | INTRAVENOUS | Status: DC

## 2019-03-03 MED ORDER — TERBUTALINE SULFATE 1 MG/ML IJ SOLN: 0.2500 mg | Freq: Once | INTRAMUSCULAR | Status: DC | PRN

## 2019-03-03 MED ORDER — SOD CITRATE-CITRIC ACID 500-334 MG/5ML PO SOLN: 30.0000 mL | ORAL | Status: DC | PRN

## 2019-03-03 MED ORDER — ONDANSETRON HCL 4 MG/2ML IJ SOLN: 4.0000 mg | Freq: Four times a day (QID) | INTRAMUSCULAR | Status: DC | PRN

## 2019-03-03 MED ORDER — DIPHENHYDRAMINE HCL 50 MG/ML IJ SOLN: 12.5000 mg | INTRAMUSCULAR | Status: DC | PRN

## 2019-03-03 MED ORDER — OXYTOCIN BOLUS FROM INFUSION
500.0000 mL | Freq: Once | INTRAVENOUS | Status: AC
  Administered 2019-03-03: 500 mL via INTRAVENOUS

## 2019-03-03 MED ORDER — SODIUM CHLORIDE 0.9 % IV SOLN
5.0000 10*6.[IU] | Freq: Once | INTRAVENOUS | Status: AC
  Administered 2019-03-03: 5 10*6.[IU] via INTRAVENOUS
  Filled 2019-03-03: qty 5

## 2019-03-03 MED ORDER — OXYTOCIN 40 UNITS IN NORMAL SALINE INFUSION - SIMPLE MED
1.0000 m[IU]/min | INTRAVENOUS | Status: DC
  Administered 2019-03-03: 2 m[IU]/min via INTRAVENOUS

## 2019-03-03 MED ORDER — LACTATED RINGERS IV SOLN: 500.0000 mL | INTRAVENOUS | Status: DC | PRN

## 2019-03-03 MED ORDER — FENTANYL-BUPIVACAINE-NACL 0.5-0.125-0.9 MG/250ML-% EP SOLN
12.0000 mL/h | EPIDURAL | Status: DC | PRN
  Filled 2019-03-03: qty 250

## 2019-03-03 NOTE — Anesthesia Procedure Notes (Signed)
Epidural Patient location during procedure: OB Start time: 03/03/2019 4:53 PM End time: 03/03/2019 5:02 PM  Staffing Anesthesiologist: Josephine Igo, MD Performed: anesthesiologist   Preanesthetic Checklist Completed: patient identified, site marked, surgical consent, pre-op evaluation, timeout performed, IV checked, risks and benefits discussed and monitors and equipment checked  Epidural Patient position: sitting Prep: site prepped and draped and DuraPrep Patient monitoring: continuous pulse ox and blood pressure Approach: midline Location: L3-L4 Injection technique: LOR air  Needle:  Needle type: Tuohy  Needle gauge: 17 G Needle length: 9 cm and 9 Needle insertion depth: 6 cm Catheter type: closed end flexible Catheter size: 19 Gauge Catheter at skin depth: 12 cm Test dose: negative and Other  Assessment Events: blood not aspirated, injection not painful, no injection resistance, negative IV test and no paresthesia  Additional Notes Patient identified. Risks and benefits discussed including failed block, incomplete  Pain control, post dural puncture headache, nerve damage, paralysis, blood pressure Changes, nausea, vomiting, reactions to medications-both toxic and allergic and post Partum back pain. All questions were answered. Patient expressed understanding and wished to proceed. Sterile technique was used throughout procedure. Epidural site was Dressed with sterile barrier dressing. No paresthesias, signs of intravascular injection Or signs of intrathecal spread were encountered.  Patient was more comfortable after the epidural was dosed. Please see RN's note for documentation of vital signs and FHR which are stable. Reason for block:procedure for pain

## 2019-03-03 NOTE — Anesthesia Preprocedure Evaluation (Signed)
Anesthesia Evaluation  Patient identified by MRN, date of birth, ID band Patient awake    Reviewed: Allergy & Precautions, Patient's Chart, lab work & pertinent test results  Airway Mallampati: II  TM Distance: >3 FB Neck ROM: Full    Dental no notable dental hx. (+) Teeth Intact   Pulmonary Current Smoker,    Pulmonary exam normal breath sounds clear to auscultation       Cardiovascular negative cardio ROS Normal cardiovascular exam Rhythm:Regular Rate:Normal     Neuro/Psych negative neurological ROS  negative psych ROS   GI/Hepatic Neg liver ROS, GERD  ,  Endo/Other  Obesity   Renal/GU negative Renal ROS  negative genitourinary   Musculoskeletal   Abdominal (+) + obese,   Peds  Hematology  (+) anemia ,   Anesthesia Other Findings   Reproductive/Obstetrics (+) Pregnancy Previous C/section                             Anesthesia Physical Anesthesia Plan  ASA: II  Anesthesia Plan: Epidural   Post-op Pain Management:    Induction:   PONV Risk Score and Plan:   Airway Management Planned: Natural Airway  Additional Equipment:   Intra-op Plan:   Post-operative Plan:   Informed Consent: I have reviewed the patients History and Physical, chart, labs and discussed the procedure including the risks, benefits and alternatives for the proposed anesthesia with the patient or authorized representative who has indicated his/her understanding and acceptance.       Plan Discussed with: Anesthesiologist  Anesthesia Plan Comments:         Anesthesia Quick Evaluation

## 2019-03-03 NOTE — MAU Note (Signed)
Covid swab performed. Pt is asymptomatic.  

## 2019-03-03 NOTE — H&P (Addendum)
23 y.o. [redacted]w[redacted]d  G3P1011 comes for schedule IOL for term.  Pt has history of c/s for NRFHT and would like TOLAC.  Consent signed.  Otherwise has good fetal movement and no bleeding.  Past Medical History:  Diagnosis Date  . Bacterial vaginitis 09/25/2016  . Medical history non-contributory     Past Surgical History:  Procedure Laterality Date  . CESAREAN SECTION N/A 12/15/2016   Procedure: CESAREAN SECTION;  Surgeon: Essie Hart, MD;  Location: [facility] BIRTHING SUITES;  Service: Obstetrics;  Laterality: N/A;  . NO PAST SURGERIES      OB History  Gravida Para Term Preterm AB Living  3 1 1   1 1   SAB TAB Ectopic Multiple Live Births  1     0 1    # Outcome Date GA Lbr Len/2nd Weight Sex Delivery Anes PTL Lv  3 Current           2 SAB 12/2016          1 Term 12/15/16 [redacted]w[redacted]d  3170 g M CS-LTranv EPI  LIV    Social History   Socioeconomic History  . Marital status: Single    Spouse name: Not on file  . Number of children: Not on file  . Years of education: Not on file  . Highest education level: Not on file  Occupational History  . Not on file  Social Needs  . Financial resource strain: Not on file  . Food insecurity    Worry: Not on file    Inability: Not on file  . Transportation needs    Medical: Not on file    Non-medical: Not on file  Tobacco Use  . Smoking status: Current Some Day Smoker  . Smokeless tobacco: Never Used  Substance and Sexual Activity  . Alcohol use: No  . Drug use: No  . Sexual activity: Yes    Birth control/protection: Patch  Lifestyle  . Physical activity    Days per week: Not on file    Minutes per session: Not on file  . Stress: Not on file  Relationships  . Social [redacted]w[redacted]d on phone: Not on file    Gets together: Not on file    Attends religious service: Not on file    Active member of club or organization: Not on file    Attends meetings of clubs or organizations: Not on file    Relationship status: Not on file  . Intimate partner  violence    Fear of current or ex partner: Not on file    Emotionally abused: Not on file    Physically abused: Not on file    Forced sexual activity: Not on file  Other Topics Concern  . Not on file  Social History Narrative  . Not on file   Patient has no known allergies.    Prenatal Transfer Tool  Maternal Diabetes: No Genetic Screening: Abnormal:  Results: Other: insufficient DNA to complete Maternal Ultrasounds/Referrals: Normal Fetal Ultrasounds or other Referrals:  Referred to Materal Fetal Medicine  Maternal Substance Abuse:  No Significant Maternal Medications:  None Significant Maternal Lab Results: Group B Strep positive  Other PNC: uncomplicated.    There were no vitals filed for this visit.  Lungs/Cor:  NAD Abdomen:  soft, gravid Ex:  no cords, erythema SVE:  3/50/-3 in office FHT/TOCO: pending assessment, pt just admitted to L&D   A/P   Admitted for term IOL H/o c/s- TOLAC desired, consent obtained  GBS Pos- PCN Pitocin 2x2 for IOL Other routine care.  Allyn Kenner

## 2019-03-04 ENCOUNTER — Encounter (HOSPITAL_COMMUNITY): Payer: Self-pay

## 2019-03-04 LAB — CBC
HCT: 30.9 % — ABNORMAL LOW (ref 36.0–46.0)
Hemoglobin: 10.1 g/dL — ABNORMAL LOW (ref 12.0–15.0)
MCH: 23.5 pg — ABNORMAL LOW (ref 26.0–34.0)
MCHC: 32.7 g/dL (ref 30.0–36.0)
MCV: 72 fL — ABNORMAL LOW (ref 80.0–100.0)
Platelets: 190 10*3/uL (ref 150–400)
RBC: 4.29 MIL/uL (ref 3.87–5.11)
RDW: 14.6 % (ref 11.5–15.5)
WBC: 10.4 10*3/uL (ref 4.0–10.5)
nRBC: 0 % (ref 0.0–0.2)

## 2019-03-04 LAB — RPR: RPR Ser Ql: NONREACTIVE

## 2019-03-04 MED ORDER — ONDANSETRON HCL 4 MG/2ML IJ SOLN: 4.0000 mg | INTRAMUSCULAR | Status: DC | PRN

## 2019-03-04 MED ORDER — OXYCODONE-ACETAMINOPHEN 5-325 MG PO TABS: 2.0000 | ORAL_TABLET | ORAL | Status: DC | PRN

## 2019-03-04 MED ORDER — ZOLPIDEM TARTRATE 5 MG PO TABS: 5.0000 mg | ORAL_TABLET | Freq: Every evening | ORAL | Status: DC | PRN

## 2019-03-04 MED ORDER — IBUPROFEN 600 MG PO TABS
600.0000 mg | ORAL_TABLET | Freq: Four times a day (QID) | ORAL | Status: DC
  Administered 2019-03-04 (×2): 600 mg via ORAL
  Filled 2019-03-04 (×2): qty 1

## 2019-03-04 MED ORDER — DIPHENHYDRAMINE HCL 25 MG PO CAPS: 25.0000 mg | ORAL_CAPSULE | Freq: Four times a day (QID) | ORAL | Status: DC | PRN

## 2019-03-04 MED ORDER — ONDANSETRON HCL 4 MG PO TABS: 4.0000 mg | ORAL_TABLET | ORAL | Status: DC | PRN

## 2019-03-04 MED ORDER — SIMETHICONE 80 MG PO CHEW: 80.0000 mg | CHEWABLE_TABLET | ORAL | Status: DC | PRN

## 2019-03-04 MED ORDER — PRENATAL MULTIVITAMIN CH
1.0000 | ORAL_TABLET | Freq: Every day | ORAL | Status: DC
  Filled 2019-03-04: qty 1

## 2019-03-04 MED ORDER — BENZOCAINE-MENTHOL 20-0.5 % EX AERO
1.0000 "application " | INHALATION_SPRAY | CUTANEOUS | Status: DC | PRN
  Administered 2019-03-04: 1 via TOPICAL
  Filled 2019-03-04: qty 56

## 2019-03-04 MED ORDER — COCONUT OIL OIL
1.0000 "application " | TOPICAL_OIL | Status: DC | PRN
  Administered 2019-03-04: 1 via TOPICAL

## 2019-03-04 MED ORDER — IBUPROFEN 100 MG/5ML PO SUSP
600.0000 mg | Freq: Four times a day (QID) | ORAL | Status: DC
  Administered 2019-03-04 – 2019-03-05 (×3): 600 mg via ORAL
  Filled 2019-03-04 (×4): qty 30

## 2019-03-04 MED ORDER — SENNOSIDES-DOCUSATE SODIUM 8.6-50 MG PO TABS
2.0000 | ORAL_TABLET | ORAL | Status: DC
  Administered 2019-03-04: 2 via ORAL
  Filled 2019-03-04: qty 2

## 2019-03-04 MED ORDER — DIBUCAINE (PERIANAL) 1 % EX OINT: 1.0000 "application " | TOPICAL_OINTMENT | CUTANEOUS | Status: DC | PRN

## 2019-03-04 MED ORDER — OXYCODONE-ACETAMINOPHEN 5-325 MG PO TABS
1.0000 | ORAL_TABLET | ORAL | Status: DC | PRN
  Filled 2019-03-04: qty 1

## 2019-03-04 MED ORDER — ACETAMINOPHEN 325 MG PO TABS
650.0000 mg | ORAL_TABLET | ORAL | Status: DC | PRN
  Administered 2019-03-04 (×2): 650 mg via ORAL
  Filled 2019-03-04 (×2): qty 2

## 2019-03-04 MED ORDER — WITCH HAZEL-GLYCERIN EX PADS: 1.0000 "application " | MEDICATED_PAD | CUTANEOUS | Status: DC | PRN

## 2019-03-04 MED ORDER — TETANUS-DIPHTH-ACELL PERTUSSIS 5-2.5-18.5 LF-MCG/0.5 IM SUSP: 0.5000 mL | Freq: Once | INTRAMUSCULAR | Status: DC

## 2019-03-04 NOTE — Lactation Note (Signed)
This note was copied from a baby's chart. Lactation Consultation Note  Patient Name: Miranda James Today's Date: 03/04/2019 Reason for consult: Initial assessment;Term;Infant weight loss  14 hours old FT female who is being exclusively BF by her mother, she's a P2. Baby is at 1% weight loss. Mom BF her first baby for 4 months and even though she came as breast/bottle she hasn't offered any formula to her baby yet, but the night shift RN has already brought a bottle of Gerber Gentle in her room, but mom told LC she's leaning more towards BF. She has an [employer] DEBP at home.  Baby was asleep on mom's bed when entering the room, mom had propped her with pillows and she had also fallen asleep with baby swaddled next to her. LC took baby back to her bassinet and educated mom on safe sleep. Noticed that baby had a pacifier on her bassinet, spoke about how pacifiers can hurt breastfeeding when it's not fully established, around 36-5 weeks of age.  Offered assistance with latch but mom politely declined, she stated baby just fed and that's why she fell asleep. Asked mom to call for assistance when needed; no LATCH score has been recorded on this chart yet. Mom also declined assistance with hand expression even though she told LC that she hasn't seen "any colostrum" yet. She also voiced that she knew how to do it and that she didn't need help at this time. Baby waking up again and gassy, checked baby's diaper and she had a stool, documented in Flowsheets. Reviewed normal newborn behavior, cluster feeding and feeding cues.  Feeding plan:  1. Encouraged mom to feed baby STS 8-12 times/24 hours or sooner if feeding cues are present 2. Hand expression and spoon/finger feeding were also encouraged as soon as colostrum becomes available  BF brochure, BF resources and feeding diary were reviewed. Mom reported all questions and concerns were answered, she's aware of [facility] OP services and will call  PRN.   Maternal Data Formula Feeding for Exclusion: Yes Reason for exclusion: Mother's choice to formula and breast feed on admission Has patient been taught Hand Expression?: Yes Does the patient have breastfeeding experience prior to this delivery?: Yes  Feeding Feeding Type: Breast Fed  LATCH Score                   Interventions Interventions: Breast feeding basics reviewed  Lactation Tools Discussed/Used WIC Program: No   Consult Status Consult Status: Follow-up Date: 03/05/19 Follow-up type: In-patient    Miranda James 03/04/2019, 1:37 PM

## 2019-03-04 NOTE — Lactation Note (Signed)
This note was copied from a baby's chart. Lactation Consultation Note  Patient Name: Girl Carlyann Espiritu Today's Date: 03/04/2019   Attempted to visit with mom but she was asleep. LC to follow up later to do initial assessment.   Maternal Data    Feeding Feeding Type: Breast Fed  LATCH Score                   Interventions    Lactation Tools Discussed/Used     Consult Status      Koni Kannan S Damir Leung 03/04/2019, 12:14 PM

## 2019-03-04 NOTE — Progress Notes (Signed)
Patient is doing well.  She is ambulating, voiding, tolerating PO.  Pain control is good.  Lochia is appropriate  Vitals:   03/04/19 0100 03/04/19 0210 03/04/19 0607 03/04/19 0954  BP: 123/75 116/76 115/67 124/80  Pulse: 82 62 (!) 57 62  Resp: 18  16 18   Temp: 98.1 F (36.7 C) 98.1 F (36.7 C) 98 F (36.7 C) 98 F (36.7 C)  TempSrc: Oral  Oral Oral  SpO2:    100%  Weight:      Height:        NAD Fundus firm Ext: no edema  Lab Results  Component Value Date   WBC 10.4 03/04/2019   HGB 10.1 (L) 03/04/2019   HCT 30.9 (L) 03/04/2019   MCV 72.0 (L) 03/04/2019   PLT 190 03/04/2019    --/--/O POS, O POS Performed at [facility], [place] [address] [place],  [address]  (11/11 1202)/[employer]Immune  A/P 23 y.o. [id number] PPD#1. Routine care.   Expect d/c tomorrow.    [place]

## 2019-03-04 NOTE — Anesthesia Postprocedure Evaluation (Signed)
Anesthesia Post Note  Patient: [name]  Procedure(s) Performed: AN AD HOC LABOR EPIDURAL     Patient location during evaluation: Mother Baby Anesthesia Type: Epidural Level of consciousness: awake, awake and alert and oriented Pain management: pain level controlled Vital Signs Assessment: post-procedure vital signs reviewed and stable Respiratory status: spontaneous breathing and respiratory function stable Cardiovascular status: blood pressure returned to baseline Postop Assessment: no headache, epidural receding, patient able to bend at knees, adequate PO intake, no backache, no apparent nausea or vomiting and able to ambulate Anesthetic complications: no    Last Vitals:  Vitals:   03/04/19 0210 03/04/19 0607  BP: 116/76 115/67  Pulse: 62 (!) 57  Resp:  16  Temp: 36.7 C 36.7 C  SpO2:      Last Pain:  Vitals:   03/04/19 0730  TempSrc:   PainSc: Asleep   Pain Goal:                   Bufford Spikes

## 2019-03-05 NOTE — Discharge Summary (Signed)
Obstetric Discharge Summary Reason for Admission: induction of labor and TOLAC Prenatal Procedures: ultrasound Intrapartum Procedures: spontaneous vaginal delivery Postpartum Procedures: none Complications-Operative and Postpartum: bilateral periurethral tears, repaired with 3-0 vicryl Hemoglobin  Date Value Ref Range Status  03/04/2019 10.1 (L) 12.0 - 15.0 g/dL Final   HCT  Date Value Ref Range Status  03/04/2019 30.9 (L) 36.0 - 46.0 % Final    Physical Exam:  General: alert and cooperative Lochia: appropriate Uterine Fundus: firm DVT Evaluation: No evidence of DVT seen on physical exam.  Discharge Diagnoses: Term Pregnancy-delivered  Discharge Information: Date: 03/05/2019 Activity: pelvic rest Diet: routine Medications: PNV and Ibuprofen Condition: stable Instructions: refer to practice specific booklet Discharge to: home Follow-up Information    Allyn Kenner, DO Follow up in 4 week(s).   Specialty: Obstetrics and Gynecology Contact information: [address] [place] [address] [phone]           Newborn Data: Live born female  Birth Weight: 6 lb 12.6 oz (3080 g) APGAR: 9, 9  Newborn Delivery   Birth date/time: 03/03/2019 22:41:00 Delivery type: Vaginal, Spontaneous      Home with mother.  Allyn Kenner 03/05/2019, 10:34 AM

## 2019-03-05 NOTE — Lactation Note (Signed)
This note was copied from a baby's chart. Lactation Consultation Note  Patient Name: Girl Jackie Polimeni Today's Date: 03/05/2019 Reason for consult: Follow-up assessment;Term;Infant weight loss;Other (Comment);Nipple pain/trauma(5 % weight loss, exp BF) Baby is 36 hours and for D/C today.  Per mom baby ate for 30 mins at 1015 and is asleep presently.  It has been noted mom had declined breast feeding assistance and had not called to have Latch assessment.  Per mom feels breast feeding is going well and hearing swallows.  Breast are fuller today. Right nipple is alittle sore. LC offered to assess and  Mom showed her the tissue. No breakdown noted just some swelling at the base of the nipple but compressible. LC recommended prior to latching breast massage, hand express, reverse pressure to make the areola more compressible for a deeper latch.  Mom tried the reverse pressure and noted the areola to be more compressible.  LC instructed mom on the use of shells between feedings except when sleeping or at least 10 mins prior to latch.  Sore nipple tx reviewed. And engorgement.  LC discussed nutritive vs non - nutritive feeding patterns and the importance of watching the baby for hanging out latched. Per mom feels the baby has been doing that some. LC stressed the importance of STS feedings until baby is back to birth weight and gaining steadily, and can stay awake for majority of feeding.  Per mom has a DEBP at home and declined a hand pump.  Mom aware of the virtual support group and the [facility] O/P services. Has the pamphlet.    Maternal Data Has patient been taught Hand Expression?: (per mom comfortable with hand expressing - demo for [facility] during assessment of sore right nipple and reverse pressure)  Feeding Feeding Type: (per mom the baby ate for 30 mins at 1015 am)  LATCH Score                   Interventions Interventions: Breast feeding basics reviewed;Shells  Lactation  Tools Discussed/Used Tools: Shells Shell Type: Inverted Pump Review: Milk Storage(showed mom on page 58 of the mother and baby care booklet)   Consult Status Consult Status: Complete Date: 03/05/19    Myer Haff 03/05/2019, 11:37 AM

## 2019-09-22 ENCOUNTER — Emergency Department (HOSPITAL_COMMUNITY)
Admission: EM | Admit: 2019-09-22 | Discharge: 2019-09-23 | Disposition: A | Discharge status: Home / Self Care | Payer: Medicaid Other | Attending: Emergency Medicine | Admitting: Emergency Medicine

## 2019-09-22 ENCOUNTER — Encounter (HOSPITAL_COMMUNITY): Payer: Self-pay

## 2019-09-22 ENCOUNTER — Other Ambulatory Visit: Payer: Self-pay

## 2019-09-22 DIAGNOSIS — Z5321 Procedure and treatment not carried out due to patient leaving prior to being seen by health care provider: Secondary | ICD-10-CM | POA: Insufficient documentation

## 2019-09-22 DIAGNOSIS — N939 Abnormal uterine and vaginal bleeding, unspecified: Secondary | ICD-10-CM | POA: Diagnosis present

## 2019-09-22 DIAGNOSIS — R1084 Generalized abdominal pain: Secondary | ICD-10-CM | POA: Insufficient documentation

## 2019-09-22 LAB — I-STAT BETA HCG BLOOD, ED (MC, WL, AP ONLY): I-stat hCG, quantitative: <5 m[IU]/mL (ref <5)

## 2019-09-22 NOTE — ED Triage Notes (Signed)
Pt arrives POV for eval of bleeding in between periods, states spotting w/ abd cramping. Reports going on x 3 days. States unknown pregnancy status

## 2019-09-23 NOTE — ED Notes (Signed)
Pt did not want to wait any longer .  Pt is gone.

## 2020-04-16 ENCOUNTER — Inpatient Hospital Stay (HOSPITAL_COMMUNITY)
Admission: AD | Admit: 2020-04-16 | Discharge: 2020-04-16 | Disposition: A | Discharge status: Home / Self Care | Payer: Medicaid Other | Attending: Obstetrics and Gynecology | Admitting: Obstetrics and Gynecology

## 2020-04-16 ENCOUNTER — Encounter (HOSPITAL_COMMUNITY): Payer: Self-pay | Admitting: Obstetrics and Gynecology

## 2020-04-16 ENCOUNTER — Other Ambulatory Visit: Payer: Self-pay

## 2020-04-16 ENCOUNTER — Inpatient Hospital Stay (HOSPITAL_COMMUNITY): Payer: Medicaid Other

## 2020-04-16 DIAGNOSIS — O3680X0 Pregnancy with inconclusive fetal viability, not applicable or unspecified: Secondary | ICD-10-CM

## 2020-04-16 DIAGNOSIS — Z3A09 9 weeks gestation of pregnancy: Secondary | ICD-10-CM | POA: Diagnosis not present

## 2020-04-16 DIAGNOSIS — O4691 Antepartum hemorrhage, unspecified, first trimester: Secondary | ICD-10-CM

## 2020-04-16 DIAGNOSIS — Z87891 Personal history of nicotine dependence: Secondary | ICD-10-CM | POA: Insufficient documentation

## 2020-04-16 DIAGNOSIS — O209 Hemorrhage in early pregnancy, unspecified: Secondary | ICD-10-CM | POA: Insufficient documentation

## 2020-04-16 DIAGNOSIS — R1030 Lower abdominal pain, unspecified: Secondary | ICD-10-CM | POA: Diagnosis not present

## 2020-04-16 DIAGNOSIS — Z6790 Unspecified blood type, Rh positive: Secondary | ICD-10-CM

## 2020-04-16 DIAGNOSIS — O4690 Antepartum hemorrhage, unspecified, unspecified trimester: Secondary | ICD-10-CM

## 2020-04-16 LAB — URINALYSIS, ROUTINE W REFLEX MICROSCOPIC
Bilirubin Urine: NEGATIVE
Glucose, UA: NEGATIVE mg/dL
Ketones, ur: NEGATIVE mg/dL
Leukocytes,Ua: NEGATIVE
Nitrite: NEGATIVE
Protein, ur: NEGATIVE mg/dL
RBC / HPF: >50 RBC/hpf — ABNORMAL HIGH (ref 0–5)
Specific Gravity, Urine: 1.013 (ref 1.005–1.030)
pH: 6 (ref 5.0–8.0)

## 2020-04-16 LAB — WET PREP, GENITAL
Clue Cells Wet Prep HPF POC: NONE SEEN
Sperm: NONE SEEN
Trich, Wet Prep: NONE SEEN
Yeast Wet Prep HPF POC: NONE SEEN

## 2020-04-16 LAB — CBC
HCT: 37 % (ref 36.0–46.0)
Hemoglobin: 11.9 g/dL — ABNORMAL LOW (ref 12.0–15.0)
MCH: 22.8 pg — ABNORMAL LOW (ref 26.0–34.0)
MCHC: 32.2 g/dL (ref 30.0–36.0)
MCV: 70.9 fL — ABNORMAL LOW (ref 80.0–100.0)
Platelets: 347 10*3/uL (ref 150–400)
RBC: 5.22 MIL/uL — ABNORMAL HIGH (ref 3.87–5.11)
RDW: 16.6 % — ABNORMAL HIGH (ref 11.5–15.5)
WBC: 9.1 10*3/uL (ref 4.0–10.5)
nRBC: 0 % (ref 0.0–0.2)

## 2020-04-16 LAB — HCG, QUANTITATIVE, PREGNANCY: hCG, Beta Chain, Quant, S: 1069 m[IU]/mL — ABNORMAL HIGH (ref <5)

## 2020-04-16 LAB — POCT PREGNANCY, URINE: Preg Test, Ur: POSITIVE — AB

## 2020-04-16 NOTE — Discharge Instructions (Signed)

## 2020-04-16 NOTE — MAU Provider Note (Signed)
History     CSN: [id number]  Arrival date and time: 04/16/20 7253   Event Date/Time   First Provider Initiated Contact with Patient 04/16/20 1923      Chief Complaint  Patient presents with  . Vaginal Bleeding   24 y.o. [id number] @[redacted]w[redacted]d  by sure LMP presenting with VB. Reports passing a small clot yesterday. She started having VB this afternoon. She soaked 1/2 of a pad over the last 4 hrs. Reports mild low abdominal cramping that started today. Rates 1/10. No recent sex.   OB History    Gravida  4   Para  2   Term  2   Preterm      AB  1   Living  2     SAB  1   IAB      Ectopic      Multiple  0   Live Births  2           Past Medical History:  Diagnosis Date  . Anemia   . Bacterial vaginitis 09/25/2016  . Medical history non-contributory     Past Surgical History:  Procedure Laterality Date  . CESAREAN SECTION N/A 12/15/2016   Procedure: CESAREAN SECTION;  Surgeon: 12/17/2016, MD;  Location: [facility] BIRTHING SUIT[facility];  Service: Obstetrics;  Laterality: N/A;  . NO PAST SURGERIES      Family History  Problem Relation Age of Onset  . Diabetes Mother   . Diabetes Maternal Grandmother   . Diabetes Maternal Grandfather     Social History   Tobacco Use  . Smoking status: Former [facility]  . Smokeless tobacco: Never Used  Vaping Use  . Vaping Use: Never used  Substance Use Topics  . Alcohol use: No  . Drug use: No    Allergies: No Known Allergies  Medications Prior to Admission  Medication Sig Dispense Refill Last Dose  . ibuprofen (ADVIL,MOTRIN) 800 MG tablet Take 1 tablet (800 mg total) by mouth 3 (three) times daily. 21 tablet 0     Review of Systems  Gastrointestinal: Positive for abdominal pain.  Genitourinary: Positive for vaginal bleeding.   Physical Exam   Blood pressure 124/69, pulse 92, temperature 99.1 F (37.3 C), temperature source Oral, resp. rate 16, height 5\' 1"  (1.549 m), weight 94.7 kg, last menstrual period 02/10/2020, SpO2 100  %, unknown if currently breastfeeding.  Physical Exam Vitals and nursing note reviewed. Exam conducted with a chaperone present.  Constitutional:      Appearance: Normal appearance.  HENT:     Head: Normocephalic and atraumatic.  Cardiovascular:     Rate and Rhythm: Normal rate.  Pulmonary:     Effort: Pulmonary effort is normal. No respiratory distress.  Abdominal:     General: There is no distension.     Palpations: Abdomen is soft. There is no mass.     Tenderness: There is no abdominal tenderness. There is no guarding or rebound.  Genitourinary:    Comments: External: no lesions or erythema Vagina: rugated, pink, moist, scant bloody discharge, sac like structure in vault, removed and sent to path Uterus: non enlarged, anteverted, non tender, no CMT Adnexae: no masses, no tenderness left, no tenderness right Cervix closed  Musculoskeletal:        General: Normal range of motion.     Cervical back: Normal range of motion.  Skin:    General: Skin is warm and dry.  Neurological:     General: No focal deficit present.  Mental Status: She is alert and oriented to person, place, and time.  Psychiatric:        Mood and Affect: Mood normal.        Behavior: Behavior normal.    Results for orders placed or performed during the hospital encounter of 04/16/20 (from the past 24 hour(s))  Pregnancy, urine POC     Status: Abnormal   Collection Time: 04/16/20  6:49 PM  Result Value Ref Range   Preg Test, Ur POSITIVE (A) NEGATIVE  Urinalysis, Routine w reflex microscopic     Status: Abnormal   Collection Time: 04/16/20  6:54 PM  Result Value Ref Range   Color, Urine YELLOW YELLOW   APPearance HAZY (A) CLEAR   Specific Gravity, Urine 1.013 1.005 - 1.030   pH 6.0 5.0 - 8.0   Glucose, UA NEGATIVE NEGATIVE mg/dL   Hgb urine dipstick LARGE (A) NEGATIVE   Bilirubin Urine NEGATIVE NEGATIVE   Ketones, ur NEGATIVE NEGATIVE mg/dL   Protein, ur NEGATIVE NEGATIVE mg/dL   Nitrite  NEGATIVE NEGATIVE   Leukocytes,Ua NEGATIVE NEGATIVE   RBC / HPF >50 (H) 0 - 5 RBC/hpf   WBC, UA 6-10 0 - 5 WBC/hpf   Bacteria, UA RARE (A) NONE SEEN   Squamous Epithelial / LPF 6-10 0 - 5   Mucus PRESENT    Ca Oxalate Crys, UA PRESENT   CBC     Status: Abnormal   Collection Time: 04/16/20  7:20 PM  Result Value Ref Range   WBC 9.1 4.0 - 10.5 K/uL   RBC 5.22 (H) 3.87 - 5.11 MIL/uL   Hemoglobin 11.9 (L) 12.0 - 15.0 g/dL   HCT 62.2 29.7 - 98.9 %   MCV 70.9 (L) 80.0 - 100.0 fL   MCH 22.8 (L) 26.0 - 34.0 pg   MCHC 32.2 30.0 - 36.0 g/dL   RDW 21.1 (H) 94.1 - 74.0 %   Platelets 347 150 - 400 K/uL   nRBC 0.0 0.0 - 0.2 %  hCG, quantitative, pregnancy     Status: Abnormal   Collection Time: 04/16/20  7:20 PM  Result Value Ref Range   hCG, Beta Chain, Quant, S 1,069 (H) <5 mIU/mL  Wet prep, genital     Status: Abnormal   Collection Time: 04/16/20  7:34 PM   Specimen: PATH Cytology Cervicovaginal Ancillary Only  Result Value Ref Range   Yeast Wet Prep HPF POC NONE SEEN NONE SEEN   Trich, Wet Prep NONE SEEN NONE SEEN   Clue Cells Wet Prep HPF POC NONE SEEN NONE SEEN   WBC, Wet Prep HPF POC FEW (A) NONE SEEN   Sperm NONE SEEN    US OB LESS THAN 14 WEEKS WITH OB TRANSVAGINAL  Result Date: 04/16/2020 CLINICAL DATA:  Vaginal bleeding. EXAM: OBSTETRIC <14 WK [place] AND TRANSVAGINAL OB US TECHNIQUE: Both transabdominal and transvaginal ultrasound examinations were performed for complete evaluation of the gestation as well as the maternal uterus, adnexal regions, and pelvic cul-de-sac. Transvaginal technique was performed to assess early pregnancy. COMPARISON:  June 06, 2017 FINDINGS: Intrauterine gestational sac: None Yolk sac:  Not Visualized. Embryo:  Not Visualized. Cardiac Activity: Not Visualized. Heart Rate: N/A  bpm Subchorionic hemorrhage:  None visualized. Maternal uterus/adnexae: The endometrium is thickened and measures approximately 1.61 cm. The bilateral ovaries are visualized and are  normal in appearance. No pelvic free fluid is seen. IMPRESSION: Thickened endometrium without evidence of an intrauterine pregnancy. Correlation with follow-up pelvic ultrasound and serial beta HCG levels  is recommended. Electronically Signed   By: Aram Candela M.D.   On: 04/16/2020 20:20   MAU Course  Procedures  MDM Labs and [place] ordered and reviewed. No IUGS, YS or FP seen on [place], findings could indicate early pregnancy, ectopic pregnancy, or failed pregnancy, discussed with pt. Will follow quant in 48 hrs. Stable for discharge home.   Assessment and Plan   1. Pregnancy of unknown anatomic location   2. Vaginal bleeding in pregnancy   3. Blood type, Rh positive    Discharge home Follow up at [facility] on 04/19/20- message left with office SAB/ectopic precautions Pelvic rest  Allergies as of 04/16/2020   No Known Allergies     Medication List    STOP taking these medications   ibuprofen 800 MG tablet Commonly known as: ADVIL      Donette Larry, CNM 04/16/2020, 8:58 PM

## 2020-04-16 NOTE — MAU Note (Signed)
Miranda James is a 24 y.o. at [redacted]w[redacted]d here in MAU reporting: yesterday while she was in the shower she saw a small blood clot. States at that time she was not bleeding or having pain. Today started having light bleeding and saw 1 more small clot. Last IC was on Friday.  LMP: 02/10/20  Onset of complaint: yesterday  Pain score: 0/10  Vitals:   04/16/20 1855  BP: 126/70  Pulse: 95  Resp: 16  Temp: 99.1 F (37.3 C)  SpO2: 100%     Lab orders placed from triage: UA, UPT

## 2020-04-17 LAB — GC/CHLAMYDIA PROBE AMP (~~LOC~~) NOT AT ARMC
Chlamydia: NEGATIVE
Comment: NEGATIVE
Comment: NORMAL
Neisseria Gonorrhea: NEGATIVE

## 2020-04-18 LAB — SURGICAL PATHOLOGY

## 2021-05-01 IMAGING — US US OB < 14 WEEKS - US OB TV
1 series · 15 of 28 positions shown · non-contrast
Comparison: June 06, 2017

CLINICAL DATA: Vaginal bleeding.

EXAM:
OBSTETRIC <14 WK US AND TRANSVAGINAL OB US
TECHNIQUE: Both transabdominal and transvaginal ultrasound examinations were
performed for complete evaluation of the gestation as well as the
maternal uterus, adnexal regions, and pelvic cul-de-sac.
Transvaginal technique was performed to assess early pregnancy.

[Series 1: us ob < 14 weeks - us ob tv · 15 of 46 slices shown]
[im 1/46]
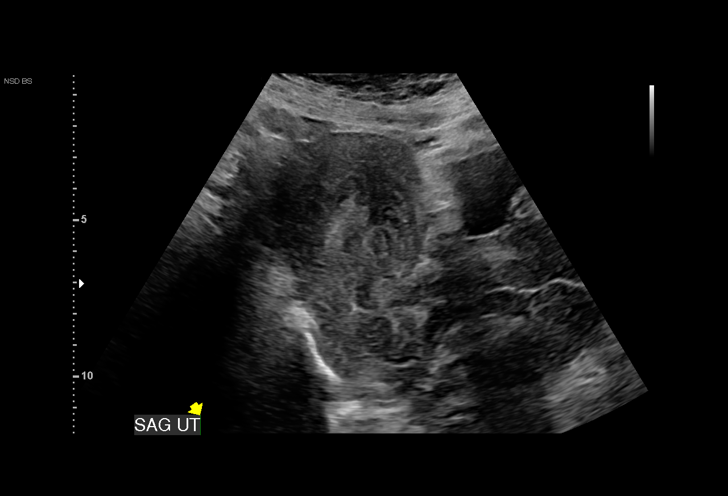
[im 4/46]
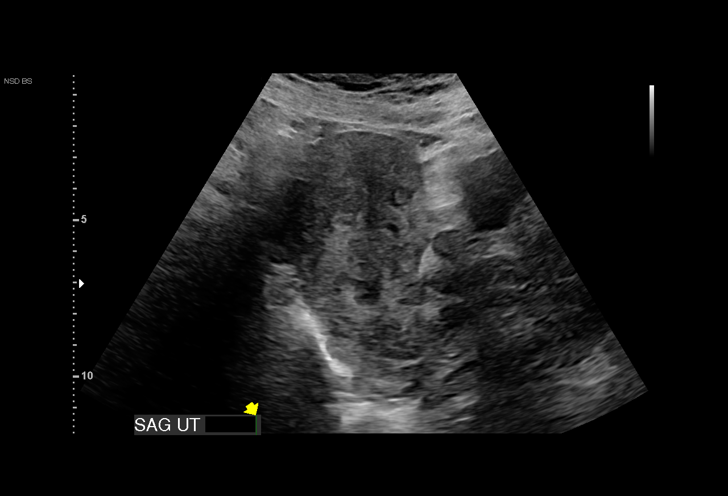
[im 7/46]
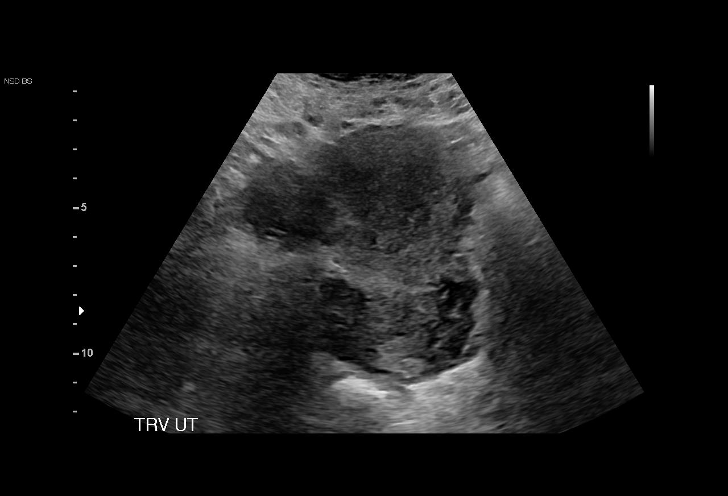
[im 11/46]
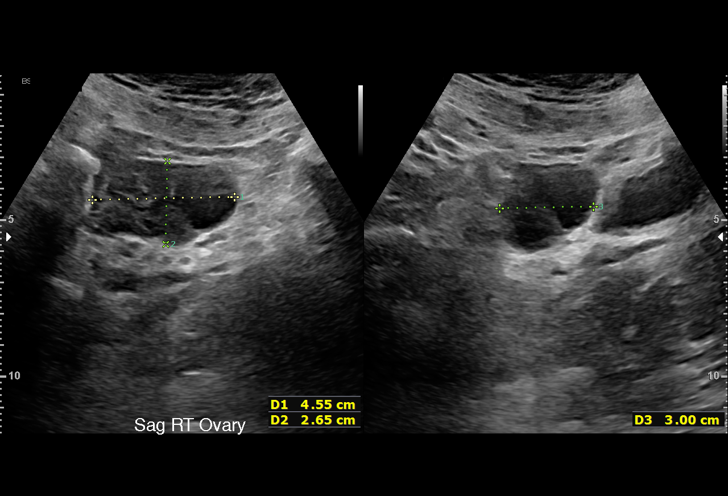
[im 14/46]
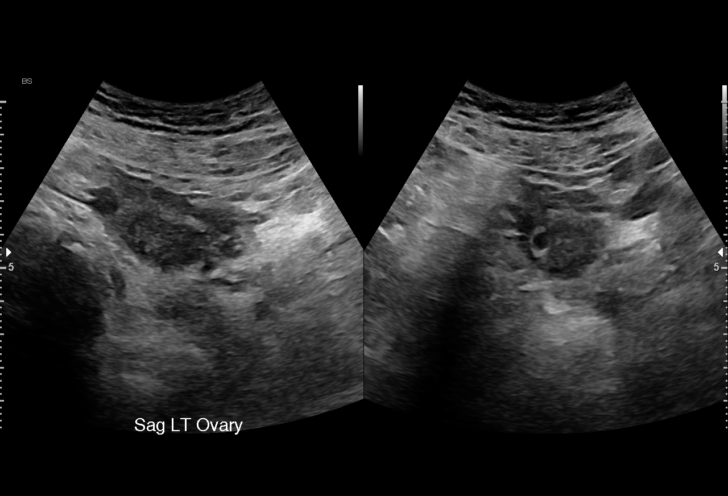
[im 17/46]
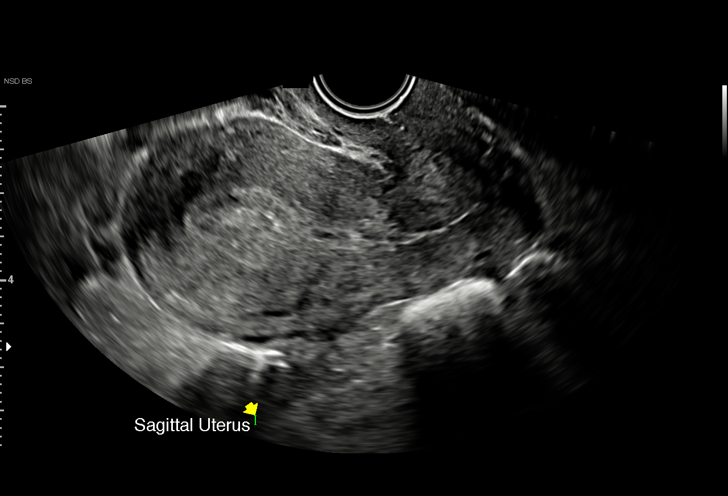
[im 21/46]
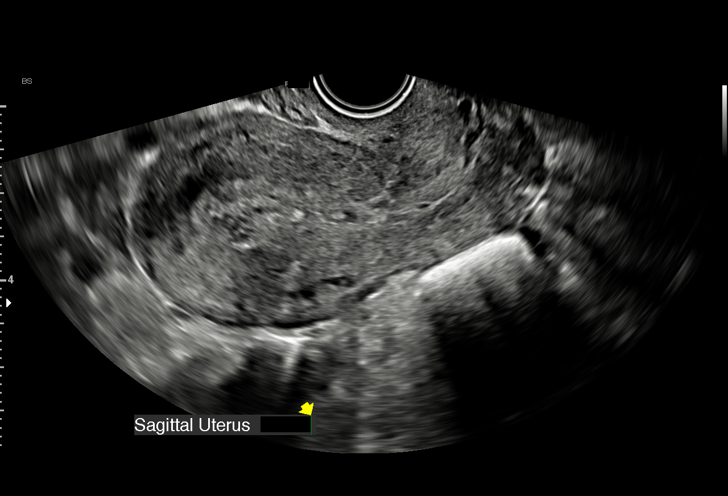
[im 24/46]
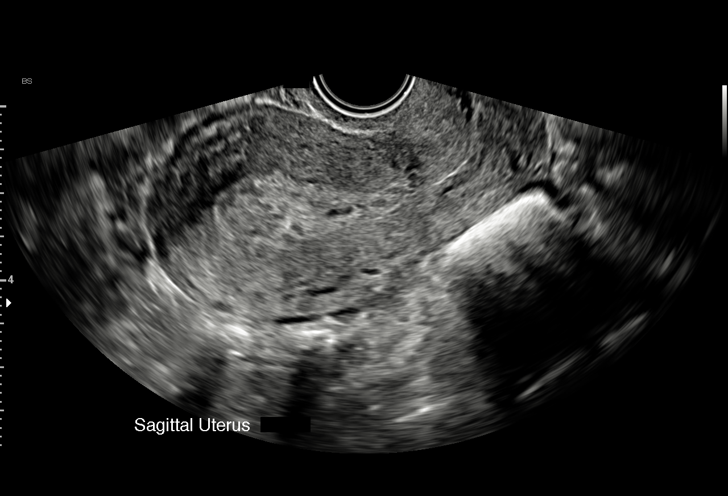
[im 26/46]
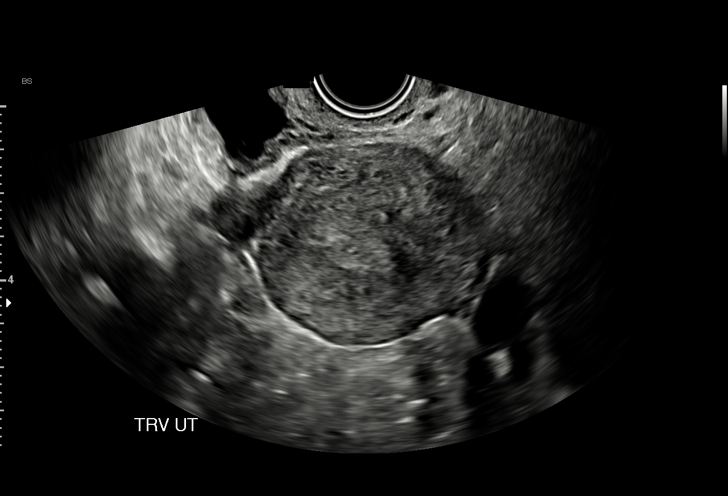
[im 29/46]
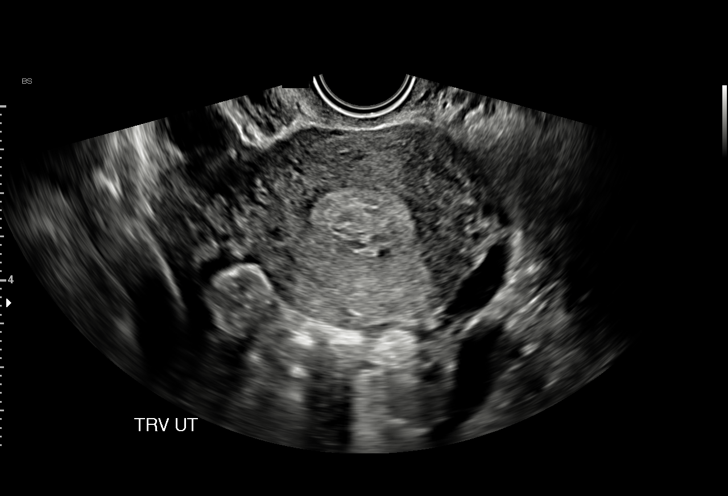
[im 32/46]
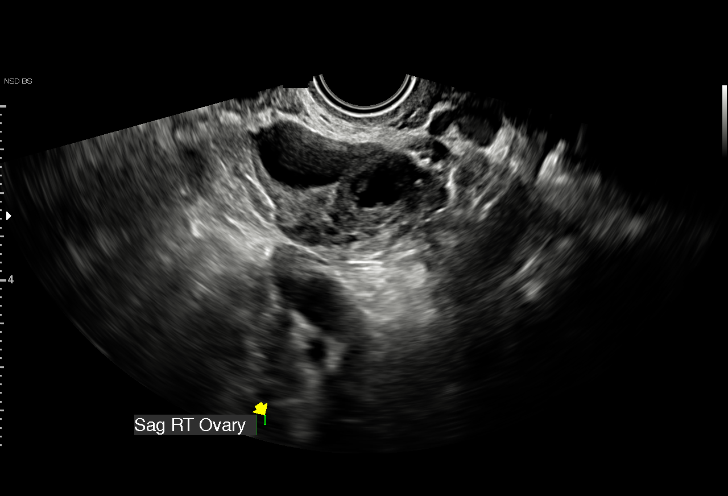
[im 36/46]
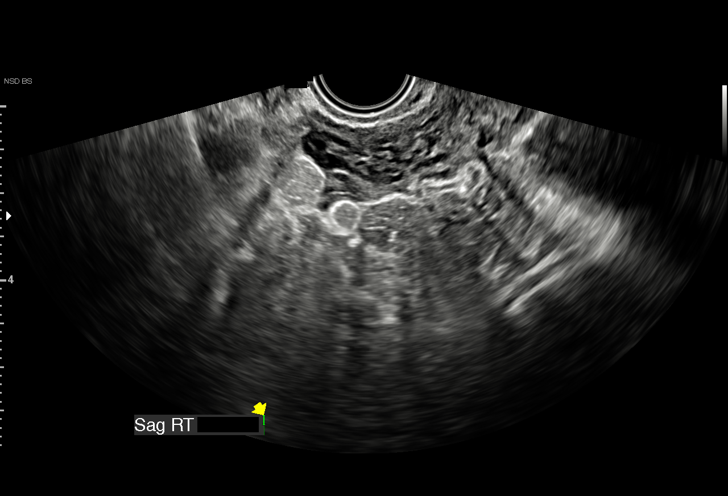
[im 39/46]
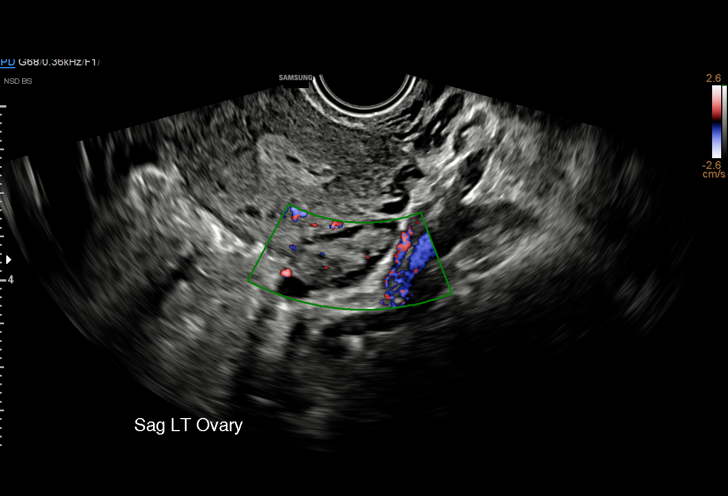
[im 42/46]
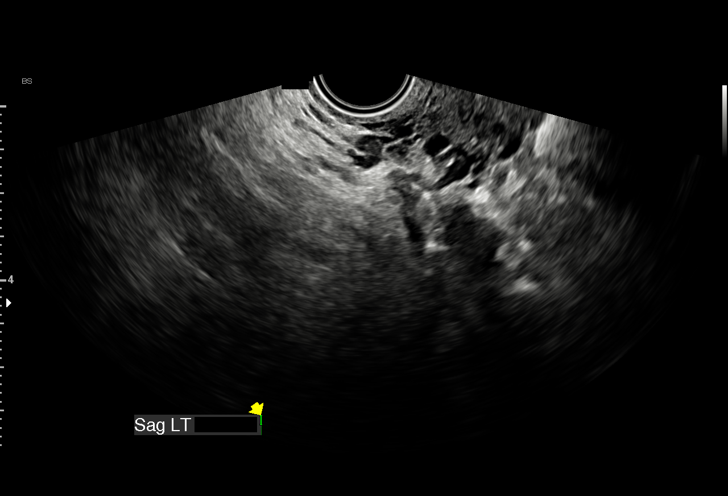
[im 46/46]
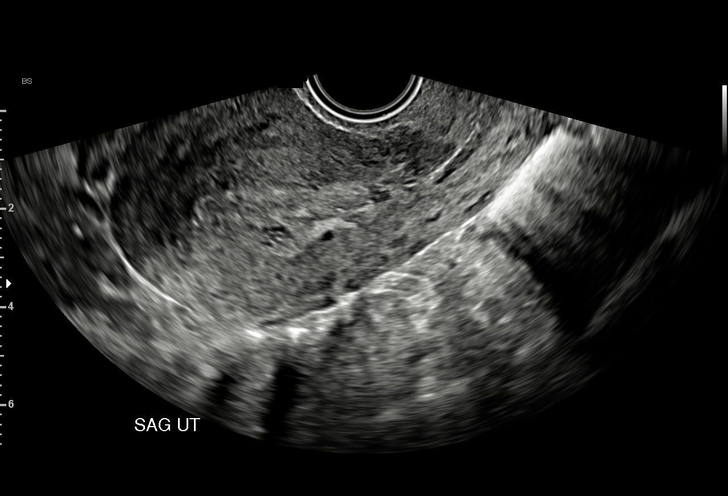

[15 of 28 positions shown; findings below may reference images not displayed]

FINDINGS: Intrauterine gestational sac: None

Yolk sac:  Not Visualized.

Embryo:  Not Visualized.

Cardiac Activity: Not Visualized.

Heart Rate: N/A  bpm

Subchorionic hemorrhage:  None visualized.

Maternal uterus/adnexae: The endometrium is thickened and measures
approximately 1.61 cm.

The bilateral ovaries are visualized and are normal in appearance.

No pelvic free fluid is seen.
IMPRESSION: Thickened endometrium without evidence of an intrauterine pregnancy.
Correlation with follow-up pelvic ultrasound and serial beta HCG
levels is recommended.
